# Patient Record
Sex: Female | Born: 1995 | Race: Black or African American | Hispanic: No | Smoking: Current every day smoker
Health system: Southern US, Community
[De-identification: ages and names within clinical notes are randomized; demographics above are authoritative.]

## PROBLEM LIST (undated history)

## (undated) DIAGNOSIS — I10 Essential (primary) hypertension: Secondary | ICD-10-CM

## (undated) DIAGNOSIS — N390 Urinary tract infection, site not specified: Secondary | ICD-10-CM

## (undated) HISTORY — PX: BLADDER NECK RECONSTRUCTION: SHX1239

---
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
## 2017-07-01 ENCOUNTER — Other Ambulatory Visit: Payer: Self-pay

## 2017-07-01 ENCOUNTER — Emergency Department (HOSPITAL_COMMUNITY): Payer: Self-pay

## 2017-07-01 ENCOUNTER — Emergency Department (HOSPITAL_COMMUNITY)
Admission: EM | Admit: 2017-07-01 | Discharge: 2017-07-02 | Disposition: A | Discharge status: Home / Self Care | Payer: Self-pay | Attending: Emergency Medicine | Admitting: Emergency Medicine

## 2017-07-01 ENCOUNTER — Encounter (HOSPITAL_COMMUNITY): Payer: Self-pay | Admitting: Emergency Medicine

## 2017-07-01 DIAGNOSIS — R002 Palpitations: Secondary | ICD-10-CM | POA: Insufficient documentation

## 2017-07-01 DIAGNOSIS — R110 Nausea: Secondary | ICD-10-CM | POA: Insufficient documentation

## 2017-07-01 DIAGNOSIS — R102 Pelvic and perineal pain: Secondary | ICD-10-CM | POA: Insufficient documentation

## 2017-07-01 DIAGNOSIS — R000 Tachycardia, unspecified: Secondary | ICD-10-CM | POA: Insufficient documentation

## 2017-07-01 DIAGNOSIS — R300 Dysuria: Secondary | ICD-10-CM | POA: Insufficient documentation

## 2017-07-01 DIAGNOSIS — R1030 Lower abdominal pain, unspecified: Secondary | ICD-10-CM | POA: Insufficient documentation

## 2017-07-01 DIAGNOSIS — R011 Cardiac murmur, unspecified: Secondary | ICD-10-CM | POA: Insufficient documentation

## 2017-07-01 DIAGNOSIS — I10 Essential (primary) hypertension: Secondary | ICD-10-CM | POA: Insufficient documentation

## 2017-07-01 DIAGNOSIS — N390 Urinary tract infection, site not specified: Secondary | ICD-10-CM | POA: Insufficient documentation

## 2017-07-01 DIAGNOSIS — R072 Precordial pain: Secondary | ICD-10-CM | POA: Insufficient documentation

## 2017-07-01 DIAGNOSIS — R42 Dizziness and giddiness: Secondary | ICD-10-CM | POA: Insufficient documentation

## 2017-07-01 DIAGNOSIS — R5383 Other fatigue: Secondary | ICD-10-CM | POA: Insufficient documentation

## 2017-07-01 DIAGNOSIS — R0602 Shortness of breath: Secondary | ICD-10-CM | POA: Insufficient documentation

## 2017-07-01 LAB — URINALYSIS, ROUTINE W REFLEX MICROSCOPIC
Bilirubin Urine: NEGATIVE
Glucose, UA: NEGATIVE mg/dL
Ketones, ur: NEGATIVE mg/dL
Nitrite: NEGATIVE
Protein, ur: 30 mg/dL — AB
Specific Gravity, Urine: 1.005 (ref 1.005–1.030)
pH: 6 (ref 5.0–8.0)

## 2017-07-01 LAB — BASIC METABOLIC PANEL
Anion gap: 10 (ref 5–15)
BUN: 11 mg/dL (ref 6–20)
CO2: 21 mmol/L — ABNORMAL LOW (ref 22–32)
Calcium: 8.9 mg/dL (ref 8.9–10.3)
Chloride: 105 mmol/L (ref 101–111)
Creatinine, Ser: 1.06 mg/dL — ABNORMAL HIGH (ref 0.44–1.00)
GFR calc Af Amer: >60 mL/min (ref >60)
GFR calc non Af Amer: >60 mL/min (ref >60)
Glucose, Bld: 81 mg/dL (ref 65–99)
Potassium: 4.4 mmol/L (ref 3.5–5.1)
Sodium: 136 mmol/L (ref 135–145)

## 2017-07-01 LAB — CBC
HCT: 33.4 % — ABNORMAL LOW (ref 36.0–46.0)
Hemoglobin: 10.8 g/dL — ABNORMAL LOW (ref 12.0–15.0)
MCH: 24.3 pg — ABNORMAL LOW (ref 26.0–34.0)
MCHC: 32.3 g/dL (ref 30.0–36.0)
MCV: 75.1 fL — ABNORMAL LOW (ref 78.0–100.0)
Platelets: 310 10*3/uL (ref 150–400)
RBC: 4.45 MIL/uL (ref 3.87–5.11)
RDW: 15.8 % — ABNORMAL HIGH (ref 11.5–15.5)
WBC: 8.9 10*3/uL (ref 4.0–10.5)

## 2017-07-01 LAB — I-STAT TROPONIN, ED: TROPONIN I, POC: 0 ng/mL (ref 0.00–0.08)

## 2017-07-01 LAB — I-STAT BETA HCG BLOOD, ED (MC, WL, AP ONLY)

## 2017-07-01 LAB — D-DIMER, QUANTITATIVE: D-Dimer, Quant: 0.45 ug/mL-FEU (ref 0.00–0.50)

## 2017-07-01 LAB — SEDIMENTATION RATE: Sed Rate: 65 mm/hr — ABNORMAL HIGH (ref 0–22)

## 2017-07-01 MED ORDER — MORPHINE SULFATE (PF) 4 MG/ML IV SOLN
4.0000 mg | Freq: Once | INTRAVENOUS | Status: AC
  Administered 2017-07-01: 4 mg via INTRAVENOUS
  Filled 2017-07-01: qty 1

## 2017-07-01 MED ORDER — DEXTROSE 5 % IV SOLN
1.0000 g | Freq: Once | INTRAVENOUS | Status: AC
  Administered 2017-07-01: 1 g via INTRAVENOUS
  Filled 2017-07-01: qty 10

## 2017-07-01 NOTE — ED Provider Notes (Signed)
[facility] DEPT Provider Note   CSN: [id number] Arrival date & time: 07/01/17  1943     History   Chief Complaint Chief Complaint  Patient presents with  . Chest Pain  . Vaginal Pain    HPI Isabella Pena is a 21 y.o. female.  The history is provided by the patient and medical records. No language interpreter was used.  Chest Pain   This is a new problem. The current episode started 3 to 5 hours ago. The problem occurs constantly. The problem has not changed since onset.The pain is present in the substernal region. The pain is at a severity of 8/10. The pain is severe. The quality of the pain is described as sharp and pressure-like (pinching pain). The pain does not radiate. Duration of episode(s) is 4 hours. Associated symptoms include abdominal pain, malaise/fatigue, nausea, palpitations and shortness of breath. Pertinent negatives include no back pain, no cough, no diaphoresis, no dizziness (lightheaded present), no exertional chest pressure, no fever, no headaches, no hemoptysis, no lower extremity edema, no near-syncope, no numbness, no sputum production, no vomiting and no weakness. She has tried nothing for the symptoms. The treatment provided no relief.  Pertinent negatives for past medical history include no seizures. Past medical history comments: prior myopericarditis    Past Medical History:  Diagnosis Date  . Hypertension   . Urinary tract infection     Patient Active Problem List   Diagnosis Date Noted  . Acute myopericarditis 05/05/2016  . Acute UTI   . Chest pain   . UTI (urinary tract infection) 05/03/2016  . Hypokalemia 05/03/2016  . Normocytic anemia 05/03/2016    Past Surgical History:  Procedure Laterality Date  . BLADDER NECK RECONSTRUCTION      OB History    No data available       Home Medications    Prior to Admission medications   Medication Sig Start Date End Date Taking? Authorizing Provider    colchicine 0.6 MG tablet Take 1 tablet (0.6 mg total) by mouth 2 (two) times daily. 05/05/16   Hongalgi, Lenis Dickinson, MD  [employer] VITAMIN D3 1000 units capsule Use as directed 1 tablet in the mouth or throat daily. 03/20/16   [provider]  ibuprofen (ADVIL,MOTRIN) 400 MG tablet Take 1 tablet (400 mg total) by mouth 2 (two) times daily. 05/21/16   Bhagat, Crista Luria, PA  metoprolol tartrate (LOPRESSOR) 25 MG tablet Take 1 tablet (25 mg total) by mouth 2 (two) times daily. 05/05/16   Hongalgi, Lenis Dickinson, MD  pantoprazole (PROTONIX) 20 MG tablet Take 1 tablet (20 mg total) by mouth daily. 05/05/16   Hongalgi, Lenis Dickinson, MD  traZODone (DESYREL) 50 MG tablet Take 1 tablet by mouth at bedtime. 04/16/16   [provider]    Family History Family History  Problem Relation Age of Onset  . Hyperlipidemia Father   . Pancreatic cancer Paternal Grandmother     Social History Social History   Tobacco Use  . Smoking status: Never Smoker  . Smokeless tobacco: Never Used  Substance Use Topics  . Alcohol use: No  . Drug use: No     Allergies   Ciprofloxacin   Review of Systems Review of Systems  Constitutional: Positive for malaise/fatigue. Negative for chills, diaphoresis, fatigue and fever.  HENT: Negative for congestion.   Eyes: Negative for visual disturbance.  Respiratory: Positive for chest tightness and shortness of breath. Negative for cough, hemoptysis, sputum production, wheezing and stridor.  Cardiovascular: Positive for chest pain and palpitations. Negative for leg swelling and near-syncope.  Gastrointestinal: Positive for abdominal pain and nausea. Negative for constipation, diarrhea and vomiting.  Genitourinary: Positive for dysuria. Negative for flank pain and frequency.  Musculoskeletal: Negative for back pain, neck pain and neck stiffness.  Skin: Negative for rash and wound.  Neurological: Positive for light-headedness. Negative for dizziness (lightheaded present),  seizures, facial asymmetry, weakness, numbness and headaches.  Psychiatric/Behavioral: Negative for agitation and confusion.  All other systems reviewed and are negative.    Physical Exam Updated Vital Signs BP 125/76 (BP Location: Left Arm)   Pulse (!) 108   Temp 98.5 F (36.9 C) (Oral)   Resp 20   Ht 5' (1.524 m)   Wt 117.8 kg (259 lb 9.6 oz)   LMP 06/29/2017   SpO2 100%   BMI 50.70 kg/m   Physical Exam  Constitutional: She is oriented to person, place, and time. She appears well-developed and well-nourished. No distress.  HENT:  Head: Normocephalic.  Mouth/Throat: Oropharynx is clear and moist. No oropharyngeal exudate.  Eyes: Conjunctivae and EOM are normal. Pupils are equal, round, and reactive to light.  Neck: Normal range of motion.  Cardiovascular: Regular rhythm and intact distal pulses. Tachycardia present.  Murmur heard.  Diastolic murmur is present. Pulmonary/Chest: Effort normal. No stridor. No tachypnea. No respiratory distress. She has no wheezes. She has no rhonchi. She has no rales. She exhibits no tenderness.  Abdominal: Soft. Normal appearance and bowel sounds are normal. She exhibits no distension. There is tenderness in the suprapubic area. There is no rigidity, no rebound and no CVA tenderness.    Musculoskeletal: She exhibits no edema or tenderness.  Neurological: She is alert and oriented to person, place, and time. No cranial nerve deficit or sensory deficit. She exhibits normal muscle tone.  Skin: Capillary refill takes less than 2 seconds. No rash noted. She is not diaphoretic. No erythema.  Psychiatric: She has a normal mood and affect.  Nursing note and vitals reviewed.    ED Treatments / Results  Labs (all labs ordered are listed, but only abnormal results are displayed) Labs Reviewed  BASIC METABOLIC PANEL - Abnormal; Notable for the following components:      Result Value   CO2 21 (*)    Creatinine, Ser 1.06 (*)    All other components  within normal limits  CBC - Abnormal; Notable for the following components:   Hemoglobin 10.8 (*)    HCT 33.4 (*)    MCV 75.1 (*)    MCH 24.3 (*)    RDW 15.8 (*)    All other components within normal limits  URINALYSIS, ROUTINE W REFLEX MICROSCOPIC - Abnormal; Notable for the following components:   APPearance TURBID (*)    Hgb urine dipstick LARGE (*)    Protein, ur 30 (*)    Leukocytes, UA LARGE (*)    Bacteria, UA MANY (*)    Squamous Epithelial / LPF 0-5 (*)    Non Squamous Epithelial 0-5 (*)    All other components within normal limits  SEDIMENTATION RATE - Abnormal; Notable for the following components:   Sed Rate 65 (*)    All other components within normal limits  URINE CULTURE  D-DIMER, QUANTITATIVE (NOT AT [facility])  I-STAT TROPONIN, ED  I-STAT BETA HCG BLOOD, ED ([facility], [facility], [facility] ONLY)  I-STAT TROPONIN, ED  I-STAT TROPONIN, ED    EKG  EKG Interpretation  Date/Time:  Monday July 01 2017 19:51:01  EST Ventricular Rate:  106 PR Interval:    QRS Duration: 83 QT Interval:  303 QTC Calculation: 403 R Axis:   65 Text Interpretation:  Sinus tachycardia Borderline Q waves in inferior leads When compared to prior, t waves improved in lead 3.  No STEMI Confirmed by Antony Blackbird [phone]) on 07/01/2017 10:07:06 PM       Radiology Dg Chest 2 View  Result Date: 07/01/2017 CLINICAL DATA:  Chest pain EXAM: CHEST  2 VIEW COMPARISON:  05/03/2017 FINDINGS: The heart size and mediastinal contours are within normal limits. Both lungs are clear. The visualized skeletal structures are unremarkable. IMPRESSION: No active cardiopulmonary disease. Electronically Signed   By: Donavan Foil M.D.   On: 07/01/2017 21:30    Procedures Procedures (including critical care time)  Medications Ordered in ED Medications  cefTRIAXone (ROCEPHIN) 1 g in dextrose 5 % 50 mL IVPB (0 g Intravenous Stopped 07/01/17 2358)  morphine 4 MG/ML injection 4 mg (4 mg Intravenous Given 07/01/17 2241)      Initial Impression / Assessment and Plan / ED Course  I have reviewed the triage vital signs and the nursing notes.  Pertinent labs & imaging results that were available during my care of the patient were reviewed by me and considered in my medical decision making (see chart for details).     Isabella Pena is a 21 y.o. 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  Patient reports that for the last 2 days she has had some pain with urination and and cloudy urine.  Patient feels that she may have a UTI.  Patient had some tenderness in her lower abdomen but no pain in the flanks or back.  Patient says that today while at work at approximately 5:30 PM, she began other symptoms including severe chest pain.  She described the pain as a pressure and pinching sensation.  She says this feels "the exact same as when I had my heart infection".  She reports that the pain did not radiate but she had associated nausea, lightheadedness, palpitations, shortness of breath, and fatigue.  She denies vomiting or diaphoresis.  She says it is not exertional and not pleuritic.  She denies any bowel symptoms or other complaints.  On arrival, patient was tachycardic.  Patient's EKG showed no evidence of ST elevation MI.     On exam, chest is nontender.  Lungs are clear.  Patient does have a diastolic murmur.  Abdomen is tender in the lower abdomen but no upper abdominal pain or flank pain or CVA tenderness.  Legs were not significantly edematous but they were large due to obesity.  Bedside ultrasound was attempted however due to patient's body habitus it was difficult to assess for pericardial effusion.  Formal ultrasound be ordered given the history of pericarditis,  myocarditis, and pericardial effusion.  Laboratory testing will be ordered to look for abnormalities.  Urinalysis showed leukocytes and bacteria.  In the setting of discomfort and her urinary symptoms she will be treated for UTI.  Rocephin was ordered.  Given the tachycardia, d-dimer was ordered.  It was negative.  Initial troponin was negative patient will likely need delta troponin as she is only had symptoms for several hours.  CBC shows no leukocytosis.  BMP overall reassuring.  Patient is awaiting ESR as she had  this evaluated and was elevated with her last pericarditis.  Patient is awaiting formal cardiac ultrasound to look for abnormalities.  Patient was given pain medicine to help with her symptoms.  Anticipate reassessment after diagnostic testing.  The patient has improvement in symptoms and has no evidence of recurrent pericarditis, pericardial effusion, or further chest pain, patient may be stable for discharge home.  Patient is safe for discharge, patient will need antibiotics for urinary tract infection and PCP follow-up.  Care transferred to Dr. Roxanne Mins while awaiting reassessment and imaging results.   Final Clinical Impressions(s) / ED Diagnoses   Final diagnoses:  Precordial pain  Shortness of breath  Lower abdominal pain  Lower urinary tract infectious disease     Clinical Impression: 1. Precordial pain   2. Shortness of breath   3. Lower abdominal pain   4. Lower urinary tract infectious disease     Disposition: Awaiting [place] results and reassessment.    Tegeler, Gwenyth Allegra, MD 07/02/17 [phone]

## 2017-07-01 NOTE — ED Triage Notes (Signed)
Patient is complaining of left chest pain. Patient states it started 30 minutes ago. Patient states she is having vaginal pain also. Patient has a hx of fluid and infection around her heart per patient.

## 2017-07-02 ENCOUNTER — Emergency Department (HOSPITAL_BASED_OUTPATIENT_CLINIC_OR_DEPARTMENT_OTHER): Payer: Self-pay

## 2017-07-02 DIAGNOSIS — I361 Nonrheumatic tricuspid (valve) insufficiency: Secondary | ICD-10-CM

## 2017-07-02 LAB — ECHOCARDIOGRAM LIMITED
Height: 60 in
Weight: 4153.6 oz

## 2017-07-02 LAB — I-STAT TROPONIN, ED: Troponin i, poc: 0 ng/mL (ref 0.00–0.08)

## 2017-07-02 MED ORDER — NITROFURANTOIN MONOHYD MACRO 100 MG PO CAPS: 100.0000 mg | ORAL_CAPSULE | Freq: Two times a day (BID) | ORAL | 0 refills | Status: DC

## 2017-07-02 NOTE — ED Notes (Signed)
[facility]. DR. Julieanne MansonEGLER MADE AWARE.

## 2017-07-02 NOTE — Progress Notes (Signed)
  Echocardiogram 2D Echocardiogram has been performed.  Isabella SkeenVijay  Isabella Pena 07/02/2017, 1:44 AM

## 2017-07-02 NOTE — ED Provider Notes (Signed)
Patient with chest discomfort and history of myocarditis signed out to me to evaluate echocardiogram.  She also was noted to have a urinary tract infection.  Echocardiogram has come back essentially normal-no wall motion abnormalities and normal ejection fraction.  Troponin is normal x2.  With her previous episode of myocarditis, she had an elevated troponin.  She states she is now feeling back to normal.  I do not feel that this is recurrent myocarditis given lack of elevated troponin and normal echocardiogram.  She is discharged with prescription for nitrofurantoin for her urinary tract infection is to follow-up with her PCP.   Dione BoozeGlick, Letrell Attwood, MD 07/02/17 [phone]

## 2017-07-02 NOTE — ED Notes (Signed)
Bed: ZO10WA16 Expected date:  Expected time:  Means of arrival:  Comments: Rm 6

## 2017-07-02 NOTE — Discharge Instructions (Signed)
Your evaluation did not show any evidence of any inflammation in the heart.  Take acetaminophen and/or ibuprofen as needed for pain.  If there is anything of concern, feel free to return for further evaluation.

## 2017-07-05 LAB — URINE CULTURE

## 2017-07-06 ENCOUNTER — Telehealth: Payer: Self-pay

## 2017-07-06 NOTE — Telephone Encounter (Signed)
Post ED Visit - Positive Culture Follow-up  Culture report reviewed by antimicrobial stewardship pharmacist:  []  Enzo BiNathan Batchelder, Pharm.D. []  Celedonio MiyamotoJeremy Frens, Pharm.D., BCPS AQ-ID []  Garvin FilaMike Maccia, Pharm.D., BCPS []  Georgina PillionElizabeth Martin, [address]Ph[address] BCPS []  Claire CityMinh Pham, [address]Ph[address]D., BCPS, AAHIVP []  Estella HuskMichelle Turner, Pharm.D., BCPS, AAHIVP []  Lysle Pearlachel Rumbarger, PharmD, BCPS []  Casilda Carlsaylor Stone, PharmD, BCPS []  Pollyann SamplesAndy Johnston, PharmD, BCPS Sharin MonsEmily Sinclair Pharm D Positive urine culture Treated with Macrobid, organism sensitive to the same and no further patient follow-up is required at this time.  Jerry CarasCullom, Lucrezia Dehne Burnett 07/06/2017, 10:28 AM

## 2017-08-02 ENCOUNTER — Emergency Department (HOSPITAL_COMMUNITY)
Admission: EM | Admit: 2017-08-02 | Discharge: 2017-08-03 | Disposition: A | Discharge status: Home / Self Care | Payer: Medicaid Other | Attending: Emergency Medicine | Admitting: Emergency Medicine

## 2017-08-02 ENCOUNTER — Encounter (HOSPITAL_COMMUNITY): Payer: Self-pay | Admitting: Emergency Medicine

## 2017-08-02 DIAGNOSIS — R319 Hematuria, unspecified: Secondary | ICD-10-CM | POA: Insufficient documentation

## 2017-08-02 DIAGNOSIS — R350 Frequency of micturition: Secondary | ICD-10-CM | POA: Insufficient documentation

## 2017-08-02 DIAGNOSIS — R300 Dysuria: Secondary | ICD-10-CM | POA: Insufficient documentation

## 2017-08-02 DIAGNOSIS — I10 Essential (primary) hypertension: Secondary | ICD-10-CM | POA: Insufficient documentation

## 2017-08-02 DIAGNOSIS — N3091 Cystitis, unspecified with hematuria: Secondary | ICD-10-CM | POA: Insufficient documentation

## 2017-08-02 LAB — URINALYSIS, ROUTINE W REFLEX MICROSCOPIC: SQUAMOUS EPITHELIAL / LPF: NONE SEEN

## 2017-08-02 LAB — POC URINE PREG, ED: PREG TEST UR: NEGATIVE

## 2017-08-02 LAB — WET PREP, GENITAL
CLUE CELLS WET PREP: NONE SEEN
Sperm: NONE SEEN
TRICH WET PREP: NONE SEEN
WBC, Wet Prep HPF POC: NONE SEEN

## 2017-08-02 MED ORDER — ACETAMINOPHEN 500 MG PO TABS
1000.0000 mg | ORAL_TABLET | Freq: Once | ORAL | Status: AC
  Administered 2017-08-03: 1000 mg via ORAL
  Filled 2017-08-02: qty 2

## 2017-08-02 MED ORDER — CEFTRIAXONE SODIUM 1 G IJ SOLR
1.0000 g | Freq: Once | INTRAMUSCULAR | Status: AC
Start: 2017-08-03 — End: 2017-08-03
  Administered 2017-08-03: 1 g via INTRAMUSCULAR
  Filled 2017-08-02: qty 10

## 2017-08-02 MED ORDER — PHENAZOPYRIDINE HCL 200 MG PO TABS
200.0000 mg | ORAL_TABLET | Freq: Three times a day (TID) | ORAL | Status: DC
  Administered 2017-08-03: 200 mg via ORAL
  Filled 2017-08-02: qty 1

## 2017-08-02 NOTE — ED Provider Notes (Signed)
[facility]-EMERGENCY DEPT Provider Note   CSN: [id number] Arrival date & time: 08/02/17  1635     History   Chief Complaint Chief Complaint  Patient presents with  . Pelvic Pain  . blood clots    HPI Isabella Pena is a 22 y.o. female.  The history is provided by the patient.  Pelvic Pain  This is a recurrent problem. The current episode started yesterday. The problem occurs constantly. The problem has not changed since onset.Pertinent negatives include no chest pain, no abdominal pain, no headaches and no shortness of breath. Nothing aggravates the symptoms. Nothing relieves the symptoms. She has tried nothing for the symptoms. The treatment provided no relief.  Dysuria   This is a recurrent problem. The current episode started more than 2 days ago. The problem occurs every urination. The problem has not changed since onset.The quality of the pain is described as burning. The pain is severe. There has been no fever. Associated symptoms include frequency and hematuria. Associated symptoms comments: Passes blood with her UTIs . She has tried nothing for the symptoms. Her past medical history is significant for recurrent UTIs. Her past medical history does not include kidney stones.  Had an Ecoli UTI that was pan sensitive a month ago.  Had gross blood with that episode as well.  No f/c/r.  Nop flank pain.    Past Medical History:  Diagnosis Date  . Hypertension   . Urinary tract infection     Patient Active Problem List   Diagnosis Date Noted  . Acute myopericarditis 05/05/2016  . Acute UTI   . Chest pain   . UTI (urinary tract infection) 05/03/2016  . Hypokalemia 05/03/2016  . Normocytic anemia 05/03/2016    Past Surgical History:  Procedure Laterality Date  . BLADDER NECK RECONSTRUCTION      OB History    No data available       Home Medications    Prior to Admission medications   Medication Sig Start Date End Date Taking?  Authorizing Provider  nitrofurantoin, macrocrystal-monohydrate, (MACROBID) 100 MG capsule Take 1 capsule (100 mg total) by mouth 2 (two) times daily. 07/02/17   Dione Booze, MD    Family History Family History  Problem Relation Age of Onset  . Hyperlipidemia Father   . Pancreatic cancer Paternal Grandmother     Social History Social History   Tobacco Use  . Smoking status: Never Smoker  . Smokeless tobacco: Never Used  Substance Use Topics  . Alcohol use: No  . Drug use: No     Allergies   Ciprofloxacin   Review of Systems Review of Systems  Constitutional: Negative for fever.  Respiratory: Negative for shortness of breath.   Cardiovascular: Negative for chest pain.  Gastrointestinal: Negative for abdominal pain.  Genitourinary: Positive for dysuria, frequency, hematuria and pelvic pain.  Neurological: Negative for headaches.  All other systems reviewed and are negative.    Physical Exam Updated Vital Signs BP 131/78 (BP Location: Right Arm)   Pulse 95   Temp 98.6 F (37 C) (Oral)   Resp 18   LMP 07/26/2017   SpO2 100%   Physical Exam  Constitutional: She is oriented to person, place, and time. She appears well-developed and well-nourished. No distress.  HENT:  Head: Normocephalic and atraumatic.  Mouth/Throat: No oropharyngeal exudate.  Eyes: Conjunctivae are normal. Pupils are equal, round, and reactive to light.  Neck: Normal range of motion.  Cardiovascular: Normal rate, regular rhythm,  normal heart sounds and intact distal pulses.  Pulmonary/Chest: Effort normal and breath sounds normal. No stridor.  Abdominal: Soft. Bowel sounds are normal. She exhibits no mass. There is no tenderness. There is no rebound and no guarding.  Genitourinary: Vaginal discharge found.  Genitourinary Comments: Chaperone present.  No CMT   Musculoskeletal: Normal range of motion.  Neurological: She is alert and oriented to person, place, and time. She displays normal  reflexes.  Skin: Skin is warm and dry. Capillary refill takes less than 2 seconds.  Psychiatric: She has a normal mood and affect.  Nursing note and vitals reviewed.    ED Treatments / Results  Labs (all labs ordered are listed, but only abnormal results are displayed)  Results for orders placed or performed during the hospital encounter of 08/02/17  Urinalysis, Routine w reflex microscopic  Result Value Ref Range   Color, Urine RED (A) YELLOW   APPearance CLOUDY (A) CLEAR   Specific Gravity, Urine  1.005 - 1.030    TEST NOT REPORTED DUE TO COLOR INTERFERENCE OF URINE PIGMENT   pH  5.0 - 8.0    TEST NOT REPORTED DUE TO COLOR INTERFERENCE OF URINE PIGMENT   Glucose, UA (A) NEGATIVE mg/dL    TEST NOT REPORTED DUE TO COLOR INTERFERENCE OF URINE PIGMENT   Hgb urine dipstick (A) NEGATIVE    TEST NOT REPORTED DUE TO COLOR INTERFERENCE OF URINE PIGMENT   Bilirubin Urine (A) NEGATIVE    TEST NOT REPORTED DUE TO COLOR INTERFERENCE OF URINE PIGMENT   Ketones, ur (A) NEGATIVE mg/dL    TEST NOT REPORTED DUE TO COLOR INTERFERENCE OF URINE PIGMENT   Protein, ur (A) NEGATIVE mg/dL    TEST NOT REPORTED DUE TO COLOR INTERFERENCE OF URINE PIGMENT   Nitrite (A) NEGATIVE    TEST NOT REPORTED DUE TO COLOR INTERFERENCE OF URINE PIGMENT   Leukocytes, UA (A) NEGATIVE    TEST NOT REPORTED DUE TO COLOR INTERFERENCE OF URINE PIGMENT   RBC / HPF TOO NUMEROUS TO COUNT 0 - 5 RBC/hpf   WBC, UA TOO NUMEROUS TO COUNT 0 - 5 WBC/hpf   Bacteria, UA MANY (A) NONE SEEN   Squamous Epithelial / LPF NONE SEEN NONE SEEN   WBC Clumps PRESENT   POC Urine Pregnancy, ED ([facility])  Result Value Ref Range   Preg Test, Ur NEGATIVE NEGATIVE   No results found.  Radiology No results found.  Procedures Procedures (including critical care time)  Medications Ordered in ED Medications - No data to display     Final Clinical Impressions(s) / ED Diagnoses  Hemorrhagic cystitis.  No flank pain to  indicate stones.  Exam and history are consistent with UTI.  Recommend follow up with PMD and urology secondary to repeat infections.   Return for weakness, numbness, changes in vision or speech,  fevers > 100.4 unrelieved by medication, shortness of breath, intractable vomiting, or diarrhea, abdominal pain, Inability to tolerate liquids or food, cough, altered mental status or any concerns. No signs of systemic illness or infection. The patient is nontoxic-appearing on exam and vital signs are within normal limits.    I have reviewed the triage vital signs and the nursing notes. Pertinent labs &imaging results that were available during my care of the patient were reviewed by me and considered in my medical decision making (see chart for details).  After history, exam, and medical workup I feel the patient has been appropriately medically screened and is  safe for discharge home. Pertinent diagnoses were discussed with the patient. Patient was given return precautions.      Renata Gambino, MD 08/03/17 [phone]

## 2017-08-02 NOTE — ED Triage Notes (Signed)
patient reports having vaginal pain and blood clots every time uses restroom. Denies any bleeding in between urinating.

## 2017-08-03 ENCOUNTER — Encounter (HOSPITAL_COMMUNITY): Payer: Self-pay | Admitting: Emergency Medicine

## 2017-08-03 MED ORDER — CEPHALEXIN 500 MG PO CAPS: 500.0000 mg | ORAL_CAPSULE | Freq: Four times a day (QID) | ORAL | 0 refills | Status: DC

## 2017-08-03 MED ORDER — STERILE WATER FOR INJECTION IJ SOLN
INTRAMUSCULAR | Status: AC
  Administered 2017-08-03: 10 mL
  Filled 2017-08-03: qty 10

## 2017-08-05 LAB — GC/CHLAMYDIA PROBE AMP (~~LOC~~) NOT AT ARMC
CHLAMYDIA, DNA PROBE: NEGATIVE
NEISSERIA GONORRHEA: NEGATIVE

## 2018-09-23 ENCOUNTER — Other Ambulatory Visit: Payer: Self-pay

## 2018-09-23 ENCOUNTER — Emergency Department (HOSPITAL_COMMUNITY)
Admission: EM | Admit: 2018-09-23 | Discharge: 2018-09-23 | Disposition: A | Discharge status: Home / Self Care | Payer: Medicaid Other | Attending: Emergency Medicine | Admitting: Emergency Medicine

## 2018-09-23 ENCOUNTER — Encounter (HOSPITAL_COMMUNITY): Payer: Self-pay | Admitting: Emergency Medicine

## 2018-09-23 DIAGNOSIS — I10 Essential (primary) hypertension: Secondary | ICD-10-CM | POA: Insufficient documentation

## 2018-09-23 DIAGNOSIS — J069 Acute upper respiratory infection, unspecified: Secondary | ICD-10-CM

## 2018-09-23 DIAGNOSIS — R1110 Vomiting, unspecified: Secondary | ICD-10-CM

## 2018-09-23 DIAGNOSIS — B9789 Other viral agents as the cause of diseases classified elsewhere: Secondary | ICD-10-CM | POA: Insufficient documentation

## 2018-09-23 LAB — INFLUENZA PANEL BY PCR (TYPE A & B)
INFLBPCR: NEGATIVE
Influenza A By PCR: NEGATIVE

## 2018-09-23 LAB — GROUP A STREP BY PCR: GROUP A STREP BY PCR: NOT DETECTED

## 2018-09-23 NOTE — Discharge Instructions (Signed)
Please take cough/cold medicine as needed Return if worsening

## 2018-09-23 NOTE — ED Triage Notes (Signed)
Pt c/o cough, fever 100 vomiting since yesterday morning. Reports fever came down after taking medications for it.  Reports works at a [employer].

## 2018-09-23 NOTE — ED Provider Notes (Signed)
[facility]-EMERGENCY DEPT Provider Note   CSN: [id number] Arrival date & time: 09/23/18  0347    History   Chief Complaint Chief Complaint  Patient presents with  . Emesis  . Cough  . Sore Throat    HPI Isabella Pena is a 23 y.o. female who presents with fever and cough.  No significant past medical history.  The patient states that yesterday she started to feel bad.  She reports a fever of 100 degrees and cough.  Cough has been nonproductive.  She took Tylenol for the fever and this resolved.  She reports associated sore throat, headache, runny nose, body aches, shortness of breath.  She also has had some posttussive vomiting.  She denies severe abdominal pain, nausea, diarrhea, urinary symptoms.  She has possible sick contacts at work.  She works at a [employer].  No recent travel.     HPI  Past Medical History:  Diagnosis Date  . Hypertension   . Urinary tract infection     Patient Active Problem List   Diagnosis Date Noted  . Acute myopericarditis 05/05/2016  . Acute UTI   . Chest pain   . UTI (urinary tract infection) 05/03/2016  . Hypokalemia 05/03/2016  . Normocytic anemia 05/03/2016    Past Surgical History:  Procedure Laterality Date  . BLADDER NECK RECONSTRUCTION       OB History   No obstetric history on file.      Home Medications    Prior to Admission medications   Medication Sig Start Date End Date Taking? Authorizing Provider  cephALEXin (KEFLEX) 500 MG capsule Take 1 capsule (500 mg total) by mouth 4 (four) times daily. Patient not taking: Reported on 09/23/2018 08/03/17   Palumbo, April, MD  nitrofurantoin, macrocrystal-monohydrate, (MACROBID) 100 MG capsule Take 1 capsule (100 mg total) by mouth 2 (two) times daily. Patient not taking: Reported on 08/02/2017 07/02/17   Dione Booze, MD    Family History Family History  Problem Relation Age of Onset  . Hyperlipidemia Father   . Pancreatic cancer Paternal  Grandmother     Social History Social History   Tobacco Use  . Smoking status: Never Smoker  . Smokeless tobacco: Never Used  Substance Use Topics  . Alcohol use: No  . Drug use: No     Allergies   Ciprofloxacin   Review of Systems Review of Systems  Constitutional: Positive for fever.  HENT: Positive for rhinorrhea and sore throat.   Respiratory: Positive for cough and shortness of breath.   Cardiovascular: Negative for chest pain.  Gastrointestinal: Positive for vomiting. Negative for abdominal pain.  All other systems reviewed and are negative.    Physical Exam Updated Vital Signs BP 133/76 (BP Location: Left Arm)   Pulse 89   Temp 98.4 F (36.9 C) (Oral)   Resp 19   LMP 09/02/2018   SpO2 99%   Physical Exam Vitals signs and nursing note reviewed.  Constitutional:      General: She is not in acute distress.    Appearance: She is well-developed. She is obese. She is not ill-appearing.     Comments: Well appearing. Texting on phone  HENT:     Head: Normocephalic and atraumatic.     Right Ear: Tympanic membrane normal.     Left Ear: Tympanic membrane normal.     Nose:     Right Turbinates: Enlarged.     Left Turbinates: Enlarged.     Mouth/Throat:  Lips: Pink.     Mouth: Mucous membranes are moist.     Pharynx: Oropharynx is clear.     Tonsils: Swelling: 1+ on the right. 1+ on the left.  Eyes:     General: No scleral icterus.       Right eye: No discharge.        Left eye: No discharge.     Conjunctiva/sclera: Conjunctivae normal.     Pupils: Pupils are equal, round, and reactive to light.  Neck:     Musculoskeletal: Normal range of motion.  Cardiovascular:     Rate and Rhythm: Normal rate and regular rhythm.  Pulmonary:     Effort: Pulmonary effort is normal. No respiratory distress.     Breath sounds: Normal breath sounds.  Abdominal:     General: There is no distension.  Skin:    General: Skin is warm and dry.  Neurological:     Mental  Status: She is alert and oriented to person, place, and time.  Psychiatric:        Behavior: Behavior normal.      ED Treatments / Results  Labs (all labs ordered are listed, but only abnormal results are displayed) Labs Reviewed  GROUP A STREP BY PCR  INFLUENZA PANEL BY PCR (TYPE A & B)    EKG None  Radiology No results found.  Procedures Procedures (including critical care time)  Medications Ordered in ED Medications - No data to display   Initial Impression / Assessment and Plan / ED Course  I have reviewed the triage vital signs and the nursing notes.  Pertinent labs & imaging results that were available during my care of the patient were reviewed by me and considered in my medical decision making (see chart for details).  23 year old female with fever, URI symptoms and posttussive vomiting since yesterday.  Her vital signs are normal here.  Her exam is overall normal. Likely viral URI. She has not had any recent travel or contacts with any known COVID pt. No high fevers. She is well appearing. Flu and Strep are negative. Do not think she needs a CXR today. She was discharged and advised supportive care and given a work note.  Final Clinical Impressions(s) / ED Diagnoses   Final diagnoses:  Viral URI with cough  Post-tussive vomiting    ED Discharge Orders    None       Bethel Born, PA-C 09/23/18 1248    Gwyneth Sprout, MD 09/23/18 2046

## 2019-05-19 ENCOUNTER — Encounter (HOSPITAL_COMMUNITY): Payer: Self-pay | Admitting: Student

## 2019-05-19 ENCOUNTER — Inpatient Hospital Stay (HOSPITAL_COMMUNITY)
Admission: AD | Admit: 2019-05-19 | Discharge: 2019-05-19 | Disposition: A | Discharge status: Home / Self Care | Payer: Medicaid Other | Attending: Family Medicine | Admitting: Family Medicine

## 2019-05-19 ENCOUNTER — Other Ambulatory Visit: Payer: Self-pay

## 2019-05-19 ENCOUNTER — Ambulatory Visit (HOSPITAL_COMMUNITY)
Admission: EM | Admit: 2019-05-19 | Discharge: 2019-05-19 | Disposition: A | Discharge status: Home / Self Care | Payer: Medicaid Other | Attending: Family Medicine | Admitting: Family Medicine

## 2019-05-19 ENCOUNTER — Encounter (HOSPITAL_COMMUNITY): Payer: Self-pay

## 2019-05-19 ENCOUNTER — Inpatient Hospital Stay (HOSPITAL_COMMUNITY): Payer: Medicaid Other

## 2019-05-19 DIAGNOSIS — O161 Unspecified maternal hypertension, first trimester: Secondary | ICD-10-CM | POA: Diagnosis not present

## 2019-05-19 DIAGNOSIS — R1030 Lower abdominal pain, unspecified: Secondary | ICD-10-CM | POA: Insufficient documentation

## 2019-05-19 DIAGNOSIS — N390 Urinary tract infection, site not specified: Secondary | ICD-10-CM | POA: Diagnosis not present

## 2019-05-19 DIAGNOSIS — Z3A01 Less than 8 weeks gestation of pregnancy: Secondary | ICD-10-CM | POA: Insufficient documentation

## 2019-05-19 DIAGNOSIS — Z800 Family history of malignant neoplasm of digestive organs: Secondary | ICD-10-CM | POA: Insufficient documentation

## 2019-05-19 DIAGNOSIS — O2341 Unspecified infection of urinary tract in pregnancy, first trimester: Secondary | ICD-10-CM | POA: Diagnosis not present

## 2019-05-19 DIAGNOSIS — O99891 Other specified diseases and conditions complicating pregnancy: Secondary | ICD-10-CM | POA: Diagnosis present

## 2019-05-19 DIAGNOSIS — O26891 Other specified pregnancy related conditions, first trimester: Secondary | ICD-10-CM

## 2019-05-19 DIAGNOSIS — O3680X0 Pregnancy with inconclusive fetal viability, not applicable or unspecified: Secondary | ICD-10-CM | POA: Diagnosis not present

## 2019-05-19 DIAGNOSIS — Z881 Allergy status to other antibiotic agents status: Secondary | ICD-10-CM | POA: Insufficient documentation

## 2019-05-19 DIAGNOSIS — O26899 Other specified pregnancy related conditions, unspecified trimester: Secondary | ICD-10-CM

## 2019-05-19 DIAGNOSIS — Z3201 Encounter for pregnancy test, result positive: Secondary | ICD-10-CM | POA: Insufficient documentation

## 2019-05-19 DIAGNOSIS — Z79899 Other long term (current) drug therapy: Secondary | ICD-10-CM | POA: Diagnosis not present

## 2019-05-19 DIAGNOSIS — R109 Unspecified abdominal pain: Secondary | ICD-10-CM

## 2019-05-19 LAB — POCT URINALYSIS DIP (DEVICE)
Bilirubin Urine: NEGATIVE
Glucose, UA: NEGATIVE mg/dL
Ketones, ur: NEGATIVE mg/dL
Nitrite: POSITIVE — AB
Protein, ur: 30 mg/dL — AB
Specific Gravity, Urine: 1.02 (ref 1.005–1.030)
Urobilinogen, UA: 0.2 mg/dL (ref 0.0–1.0)
pH: 7 (ref 5.0–8.0)

## 2019-05-19 LAB — CBC
HCT: 36.3 % (ref 36.0–46.0)
Hemoglobin: 11.2 g/dL — ABNORMAL LOW (ref 12.0–15.0)
MCH: 25.5 pg — ABNORMAL LOW (ref 26.0–34.0)
MCHC: 30.9 g/dL (ref 30.0–36.0)
MCV: 82.5 fL (ref 80.0–100.0)
Platelets: 319 10*3/uL (ref 150–400)
RBC: 4.4 MIL/uL (ref 3.87–5.11)
RDW: 14.7 % (ref 11.5–15.5)
WBC: 11.5 10*3/uL — ABNORMAL HIGH (ref 4.0–10.5)
nRBC: 0 % (ref 0.0–0.2)

## 2019-05-19 LAB — POCT PREGNANCY, URINE: Preg Test, Ur: POSITIVE — AB

## 2019-05-19 LAB — WET PREP, GENITAL
Sperm: NONE SEEN
Trich, Wet Prep: NONE SEEN
Yeast Wet Prep HPF POC: NONE SEEN

## 2019-05-19 LAB — HCG, QUANTITATIVE, PREGNANCY: hCG, Beta Chain, Quant, S: 3275 m[IU]/mL — ABNORMAL HIGH (ref <5)

## 2019-05-19 LAB — ABO/RH
ABO/RH(D): A NEG
Antibody Screen: NEGATIVE

## 2019-05-19 MED ORDER — CEPHALEXIN 500 MG PO CAPS: 500.0000 mg | ORAL_CAPSULE | Freq: Two times a day (BID) | ORAL | 0 refills | Status: DC

## 2019-05-19 MED ORDER — DOXYLAMINE-PYRIDOXINE 10-10 MG PO TBEC: 2.0000 | DELAYED_RELEASE_TABLET | Freq: Every evening | ORAL | 0 refills | Status: DC | PRN

## 2019-05-19 NOTE — ED Triage Notes (Signed)
Patient presents to Urgent Care with complaints of lower abdominal pain since the past week, worse the last few days. Patient reports frequent urination.

## 2019-05-19 NOTE — MAU Note (Signed)
Pt presents to MAU c/o abdominal pain that is along her lower abdomen pt reports the pain is a 7/10 pt reports the pain feels like she is super bloated and has an occasional cramp. No vaginal bleeding. Pt reports having some nausea as well that has been ongoing x1week. Pts LMP was September 30th 2020. Pt was sent over from urgent care after testing positive on the UPT and being told she has a UTI. Pt states they instructed her to come get a [place].

## 2019-05-19 NOTE — MAU Provider Note (Signed)
History     CSN: [id number]  Arrival date and time: 05/19/19 1843   First Provider Initiated Contact with Patient 05/19/19 2047      Chief Complaint  Patient presents with  . Abdominal Pain  . Nausea  . Urinary Tract Infection   HPI Isabella Pena is a 23 y.o. G1P0 at [redacted]w[redacted]d based on certain LMP who presents to MAU with chief complaint of abdominal pain in early pregnancy. This is a new complaint, onset within the past week. Her pain is located in her lower abdomen, rated as 7/10 and does not radiate. She denies abdominal pain upon arrival to MAU.  Patient is s/p diagnosis of UTI at urgent care today. She denies dysuria, vaginal bleeding, abdominal tenderness, fever or recent illness.  OB History    Gravida  1   Para      Term      Preterm      AB      Living        SAB      TAB      Ectopic      Multiple      Live Births              Past Medical History:  Diagnosis Date  . Hypertension   . Urinary tract infection     Past Surgical History:  Procedure Laterality Date  . BLADDER NECK RECONSTRUCTION      Family History  Problem Relation Age of Onset  . Hyperlipidemia Father   . Asthma Father   . Pancreatic cancer Paternal Grandmother   . Healthy Mother     Social History   Tobacco Use  . Smoking status: Never Smoker  . Smokeless tobacco: Never Used  Substance Use Topics  . Alcohol use: No  . Drug use: No    Allergies:  Allergies  Allergen Reactions  . Ciprofloxacin Other (See Comments)    UTI and doesn't work for patient    Medications Prior to Admission  Medication Sig Dispense Refill Last Dose  . acetaminophen (TYLENOL) 500 MG tablet Take 1,000 mg by mouth every 6 (six) hours as needed for mild pain or headache.   Past Week at Unknown time  . cephALEXin (KEFLEX) 500 MG capsule Take 1 capsule (500 mg total) by mouth 2 (two) times daily for 7 days. 14 capsule 0   . Doxylamine-Pyridoxine 10-10 MG TBEC Take 2 tablets by mouth  at bedtime as needed. 60 tablet 0     Review of Systems  Constitutional: Negative for fever.  Gastrointestinal: Positive for abdominal pain.  Genitourinary: Negative for difficulty urinating, dysuria, vaginal bleeding and vaginal discharge.  Musculoskeletal: Negative for back pain.  All other systems reviewed and are negative.  Physical Exam   Blood pressure (!) 142/78, pulse 92, temperature 98.4 F (36.9 C), temperature source Oral, resp. rate 18, weight (!) 141 kg, last menstrual period 04/08/2019, SpO2 99 %.  Physical Exam  Nursing note and vitals reviewed. Constitutional: She is oriented to person, place, and time. She appears well-developed and well-nourished.  Cardiovascular: Normal rate.  Respiratory: Effort normal and breath sounds normal.  GI: Soft.  Neurological: She is alert and oriented to person, place, and time.  Skin: Skin is warm and dry.  Psychiatric: She has a normal mood and affect. Thought content normal.    MAU Course/MDM  Procedures  --vaginal swabs collected by RN via blind swab. Clue cells, no other criteria noted, treatment for BV withheld  Patient Vitals for the past 24 hrs:  BP Temp Temp src Pulse Resp SpO2 Weight  05/19/19 1913 (!) 142/78 98.4 F (36.9 C) Oral 92 18 - -  05/19/19 1912 - - - - - 99 % -  05/19/19 1908 - - - - - - (!) 141 kg   Results for orders placed or performed during the hospital encounter of 05/19/19 (from the past 24 hour(s))  Wet prep, genital     Status: Abnormal   Collection Time: 05/19/19  7:25 PM  Result Value Ref Range   Yeast Wet Prep HPF POC NONE SEEN NONE SEEN   Trich, Wet Prep NONE SEEN NONE SEEN   Clue Cells Wet Prep HPF POC PRESENT (A) NONE SEEN   WBC, Wet Prep HPF POC FEW (A) NONE SEEN   Sperm NONE SEEN   CBC     Status: Abnormal   Collection Time: 05/19/19  8:15 PM  Result Value Ref Range   WBC 11.5 (H) 4.0 - 10.5 K/uL   RBC 4.40 3.87 - 5.11 MIL/uL   Hemoglobin 11.2 (L) 12.0 - 15.0 g/dL   HCT 94.5  85.9 - 29.2 %   MCV 82.5 80.0 - 100.0 fL   MCH 25.5 (L) 26.0 - 34.0 pg   MCHC 30.9 30.0 - 36.0 g/dL   RDW 44.6 28.6 - 38.1 %   Platelets 319 150 - 400 K/uL   nRBC 0.0 0.0 - 0.2 %  ABO/Rh     Status: None (Preliminary result)   Collection Time: 05/19/19  8:15 PM  Result Value Ref Range   ABO/RH(D)      A NEG Performed at [facility], [address], [place], [place] [address]    Antibody Screen PENDING    US Ob Less Than 14 Weeks With Ob Transvaginal  Result Date: 05/19/2019 CLINICAL DATA:  Pelvic pain for 2-3 days with vaginal bleeding and positive pregnancy test EXAM: OBSTETRIC <14 WK [place] AND TRANSVAGINAL OB US TECHNIQUE: Both transabdominal and transvaginal ultrasound examinations were performed for complete evaluation of the gestation as well as the maternal uterus, adnexal regions, and pelvic cul-de-sac. Transvaginal technique was performed to assess early pregnancy. COMPARISON:  None. FINDINGS: Intrauterine gestational sac: Present Yolk sac:  Absent Embryo:  Absent MSD: 4.7 mm mm   5 w   1 d Subchorionic hemorrhage:  None visualized. Maternal uterus/adnexae: Ovaries are within normal limits with a small corpus luteum cyst in the right ovary. IMPRESSION: Probable early intrauterine gestational sac, but no yolk sac, fetal pole, or cardiac activity yet visualized. Recommend follow-up quantitative B-HCG levels and follow-up US in 14 days to assess viability. This recommendation follows SRU consensus guidelines: Diagnostic Criteria for Nonviable Pregnancy Early in the First Trimester. Malva Limes Med 2013; 579:0383-33. Electronically Signed   By: Alcide Clever M.D.   On: 05/19/2019 20:41   Assessment and Plan  --23 y.o. G1P0 with pregnancy of unknown location --Denies abdominal pain throughout time in MAU --Discharge home in stable condition with ectopic/first trimester precautions  F/U: --State Quant Friday at 0900 at [facility], CNM 05/19/2019, 9:24 PM

## 2019-05-19 NOTE — Discharge Instructions (Signed)
Please go to [facility] MAU for further evaluation of abdominal pain Please follow-up with women's clinic for further OB GYN care Begin prenatal vitamin Read attached on prenatal care Diclegis: Two tablets at bedtime on day 1 and 2; if symptoms persist, take 1 tablet in morning and 2 tablets at bedtime on day 3; if symptoms persist, may increase to 1 tablet in morning, 1 tablet mid-afternoon, and 2 tablets at bedtime on day 4 (maximum: doxylamine 40 mg/pyridoxine 40 mg (4 tablets) per day). OR One-half of the 25 mg Unisom sleep tablet over-the-counter tablet or two chewable 5 mg tablets can be used off-label as an antiemetic. In addition, pyridoxine 25 mg, also available over-the-counter, is taken three or four times per day;This is a reasonable, less expensive substitute for combination tablets.  Please begin taking Keflex twice daily for the next week to treat UTI.  Please drink plenty of fluids. Follow-up if symptoms not resolving or worsening

## 2019-05-19 NOTE — Discharge Instructions (Signed)
First Trimester of Pregnancy ° °The first trimester of pregnancy is from week 1 until the end of week 13 (months 1 through 3). During this time, your baby will begin to develop inside you. At 6-8 weeks, the eyes and face are formed, and the heartbeat can be seen on ultrasound. At the end of 12 weeks, all the baby's organs are formed. Prenatal care is all the medical care you receive before the birth of your baby. Make sure you get good prenatal care and follow all of your doctor's instructions. °Follow these instructions at home: °Medicines °· Take over-the-counter and prescription medicines only as told by your doctor. Some medicines are safe and some medicines are not safe during pregnancy. °· Take a prenatal vitamin that contains at least 600 micrograms (mcg) of folic acid. °· If you have trouble pooping (constipation), take medicine that will make your stool soft (stool softener) if your doctor approves. °Eating and drinking ° °· Eat regular, healthy meals. °· Your doctor will tell you the amount of weight gain that is right for you. °· Avoid raw meat and uncooked cheese. °· If you feel sick to your stomach (nauseous) or throw up (vomit): °? Eat 4 or 5 small meals a day instead of 3 large meals. °? Try eating a few soda crackers. °? Drink liquids between meals instead of during meals. °· To prevent constipation: °? Eat foods that are high in fiber, like fresh fruits and vegetables, whole grains, and beans. °? Drink enough fluids to keep your pee (urine) clear or pale yellow. °Activity °· Exercise only as told by your doctor. Stop exercising if you have cramps or pain in your lower belly (abdomen) or low back. °· Do not exercise if it is too hot, too humid, or if you are in a place of great height (high altitude). °· Try to avoid standing for long periods of time. Move your legs often if you must stand in one place for a long time. °· Avoid heavy lifting. °· Wear low-heeled shoes. Sit and stand up  straight. °· You can have sex unless your doctor tells you not to. °Relieving pain and discomfort °· Wear a good support bra if your breasts are sore. °· Take warm water baths (sitz baths) to soothe pain or discomfort caused by hemorrhoids. Use hemorrhoid cream if your doctor says it is okay. °· Rest with your legs raised if you have leg cramps or low back pain. °· If you have puffy, bulging veins (varicose veins) in your legs: °? Wear support hose or compression stockings as told by your doctor. °? Raise (elevate) your feet for 15 minutes, 3-4 times a day. °? Limit salt in your food. °Prenatal care °· Schedule your prenatal visits by the twelfth week of pregnancy. °· Write down your questions. Take them to your prenatal visits. °· Keep all your prenatal visits as told by your doctor. This is important. °Safety °· Wear your seat belt at all times when driving. °· Make a list of emergency phone numbers. The list should include numbers for family, friends, the hospital, and police and fire departments. °General instructions °· Ask your doctor for a referral to a local prenatal class. Begin classes no later than at the start of month 6 of your pregnancy. °· Ask for help if you need counseling or if you need help with nutrition. Your doctor can give you advice or tell you where to go for help. °· Do not use hot tubs, steam   rooms, or saunas.  Do not douche or use tampons or scented sanitary pads.  Do not cross your legs for long periods of time.  Avoid all herbs and alcohol. Avoid drugs that are not approved by your doctor.  Do not use any tobacco products, including cigarettes, chewing tobacco, and electronic cigarettes. If you need help quitting, ask your doctor. You may get counseling or other support to help you quit.  Avoid cat litter boxes and soil used by cats. These carry germs that can cause birth defects in the baby and can cause a loss of your baby (miscarriage) or stillbirth.  Visit your dentist.  At home, brush your teeth with a soft toothbrush. Be gentle when you floss. Contact a doctor if:  You are dizzy.  You have mild cramps or pressure in your lower belly.  You have a nagging pain in your belly area.  You continue to feel sick to your stomach, you throw up, or you have watery poop (diarrhea).  You have a bad smelling fluid coming from your vagina.  You have pain when you pee (urinate).  You have increased puffiness (swelling) in your face, hands, legs, or ankles. Get help right away if:  You have a fever.  You are leaking fluid from your vagina.  You have spotting or bleeding from your vagina.  You have very bad belly cramping or pain.  You gain or lose weight rapidly.  You throw up blood. It may look like coffee grounds.  You are around people who have [place] measles, fifth disease, or chickenpox.  You have a very bad headache.  You have shortness of breath.  You have any kind of trauma, such as from a fall or a car accident. Summary  The first trimester of pregnancy is from week 1 until the end of week 13 (months 1 through 3).  To take care of yourself and your unborn baby, you will need to eat healthy meals, take medicines only if your doctor tells you to do so, and do activities that are safe for you and your baby.  Keep all follow-up visits as told by your doctor. This is important as your doctor will have to ensure that your baby is healthy and growing well. This information is not intended to replace advice given to you by your health care provider. Make sure you discuss any questions you have with your health care provider. Document Released: 12/12/2007 Document Revised: 10/16/2018 Document Reviewed: 07/03/2016 Elsevier Patient Education  2020 [place].   Human Chorionic Gonadotropin Test Why am I having this test? A human chorionic gonadotropin (hCG) test is done to determine whether you are pregnant. It can also be used:  To diagnose an  abnormal pregnancy.  To determine whether you have had a failed pregnancy (miscarriage) or are at risk of one. What is being tested? This test checks the level of the human chorionic gonadotropin (hCG) hormone in the blood. This hormone is produced during pregnancy by the cells that form the placenta. The placenta is the organ that grows inside your womb (uterus) to nourish a developing baby. When you are pregnant, hCG can be detected in your blood or urine 7 to 8 days before your missed period. It continues to go up for the first 8-10 weeks of pregnancy. The presence of hCG in your blood can be measured with several different types of tests. You may have:  A urine test. ? Because this hormone is eliminated from your body  by your kidneys, you may have a urine test to find out whether you are pregnant. A home pregnancy test detects whether there is hCG in your urine. ? A urine test only shows whether there is hCG in your urine. It does not measure how much.  A qualitative blood test. ? You may have this type of blood test to find out if you are pregnant. ? This blood test only shows whether there is hCG in your blood. It does not measure how much.  A quantitative blood test. ? This type of blood test measures the amount of hCG in your blood. ? You may have this test to:  Diagnose an abnormal pregnancy.  Check whether you have had a miscarriage.  Determine whether you are at risk of a miscarriage. What kind of sample is taken?     Two kinds of samples may be collected to test for the hCG hormone.  Blood. It is usually collected by inserting a needle into a blood vessel.  Urine. It is usually collected by urinating into a germ-free (sterile) specimen cup. It is best to collect the sample the first time you urinate in the morning. How do I prepare for this test? No preparation is needed for a blood test.  For the urine test:  Let your health care provider know about: ? All medicines  you are taking, including vitamins, herbs, creams, and over-the-counter medicines. ? Any blood in your urine. This may interfere with the result.  Do not drink too much fluid. Drink as you normally would, or as directed by your health care provider. How are the results reported? Depending on the type of test that you have, your test results may be reported as values. Your health care provider will compare your results to normal ranges that were established after testing a large group of people (reference ranges). Reference ranges may vary among labs and hospitals. For this test, common reference ranges that show absence of pregnancy are:  Quantitative hCG blood levels: less than 5 IU/L. Other results will be reported as either positive or negative. For this test, normal results (meaning the absence of pregnancy) are:  Negative for hCG in the urine test.  Negative for hCG in the qualitative blood test. What do the results mean? Urine and qualitative blood test  A negative result could mean: ? That you are not pregnant. ? That the test was done too early in your pregnancy to detect hCG in your blood or urine. If you still have other signs of pregnancy, the test will be repeated.  A positive result means: ? That you are most likely pregnant. Your health care provider may confirm your pregnancy with an imaging study (ultrasound) of your uterus, if needed. Quantitative blood test Results of the quantitative hCG blood test will be interpreted as follows:  Less than 5 IU/L: You are most likely not pregnant.  Greater than 25 IU/L: You are most likely pregnant.  hCG levels that are higher than expected: ? You are pregnant with twins. ? You have abnormal growths in the uterus.  hCG levels that are rising more slowly than expected: ? You have an ectopic pregnancy (also called a tubal pregnancy).  hCG levels that are falling: ? You may be having a miscarriage. Talk with your health care  provider about what your results mean. Questions to ask your health care provider Ask your health care provider, or the department that is doing the test:  When will my  results be ready?  How will I get my results?  What are my treatment options?  What other tests do I need?  What are my next steps? Summary  A human chorionic gonadotropin test is done to determine whether you are pregnant.  When you are pregnant, hCG can be detected in your blood or urine 7 to 8 days before your missed period. It continues to go up for the first 8-10 weeks of pregnancy.  Your hCG level can be measured with different types of tests. You may have a urine test, a qualitative blood test, or a quantitative blood test.  Talk with your health care provider about what your results mean. This information is not intended to replace advice given to you by your health care provider. Make sure you discuss any questions you have with your health care provider. Document Released: 07/27/2004 Document Revised: 05/27/2017 Document Reviewed: 05/27/2017 Elsevier Patient Education  2020 [employer].

## 2019-05-20 NOTE — ED Provider Notes (Signed)
[facility]    CSN: [id number] Arrival date & time: 05/19/19  1651      History   Chief Complaint Chief Complaint  Patient presents with  . Abdominal Pain    HPI Isabella Pena is a 23 y.o. female history of hypertension, bladder neck reconstruction presenting today for evaluation of abdominal pain.  Patient states that over the past week she has had discomfort in her lower abdomen.  Over the past 2 to 3 days it has been worse.  She has had some associated nausea, but denies vomiting.  Denies changes in bowel movements.  She has had some urinary frequency, but denies dysuria.  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  Since she has had less frequent UTIs.  States that this feels slightly different than her typical UTIs.  Notes that her last menstrual cycle was 9/30, ended on 10/4.  She is not on any form of birth control.  Cycles are typically regular.  States that she was told from an early age that she would have difficulty with pregnancy due to her surgery as she believes she was told that there was some damage to her uterus.  HPI  Past Medical History:  Diagnosis Date  . Hypertension   . Urinary tract infection     Patient Active Problem List   Diagnosis Date Noted  . Acute myopericarditis 05/05/2016  . Acute UTI   . Chest pain   . UTI (urinary tract infection) 05/03/2016  . Hypokalemia 05/03/2016  . Normocytic anemia 05/03/2016    Past Surgical History:  Procedure Laterality Date  . BLADDER NECK RECONSTRUCTION      OB History    Gravida  1   Para      Term      Preterm      AB      Living        SAB      TAB      Ectopic      Multiple      Live Births               Home Medications    Prior to Admission medications   Medication Sig Start Date End Date Taking? Authorizing Provider  acetaminophen (TYLENOL) 500 MG tablet Take 1,000 mg by mouth every  6 (six) hours as needed for mild pain or headache.    [provider]  Doxylamine-Pyridoxine 10-10 MG TBEC Take 2 tablets by mouth at bedtime as needed. 05/19/19   Shawon Denzer, Elesa Hacker, PA-C    Family History Family History  Problem Relation Age of Onset  . Hyperlipidemia Father   . Asthma Father   . Pancreatic cancer Paternal Grandmother   . Healthy Mother     Social History Social History   Tobacco Use  . Smoking status: Never Smoker  . Smokeless tobacco: Never Used  Substance Use Topics  . Alcohol use: No  . Drug use: No     Allergies   Ciprofloxacin   Review of Systems Review of Systems  Constitutional: Negative for fever.  Respiratory: Negative for shortness of breath.   Cardiovascular: Negative for chest pain.  Gastrointestinal: Positive for abdominal pain and nausea. Negative for diarrhea and vomiting.  Genitourinary: Positive for frequency and menstrual problem. Negative for dysuria, flank pain, genital sores, hematuria, vaginal bleeding, vaginal discharge and vaginal pain.  Musculoskeletal: Negative for back  pain.  Skin: Negative for rash.  Neurological: Negative for dizziness, light-headedness and headaches.     Physical Exam Triage Vital Signs ED Triage Vitals  Enc Vitals Group     BP 05/19/19 1738 (!) 145/101     Pulse Rate 05/19/19 1738 89     Resp 05/19/19 1738 16     Temp 05/19/19 1738 98.9 F (37.2 C)     Temp Source 05/19/19 1738 Oral     SpO2 05/19/19 1738 99 %     Weight --      Height --      Head Circumference --      Peak Flow --      Pain Score 05/19/19 1736 6     Pain Loc --      Pain Edu? --      Excl. in GC? --    No data found.  Updated Vital Signs BP (!) 145/101 (BP Location: Left Wrist) Comment (BP Location): reading taken on left wrist  Pulse 89   Temp 98.9 F (37.2 C) (Oral)   Resp 16   LMP 04/08/2019   SpO2 99%   Visual Acuity Right Eye Distance:   Left Eye Distance:   Bilateral Distance:    Right Eye  Near:   Left Eye Near:    Bilateral Near:     Physical Exam Vitals signs and nursing note reviewed.  Constitutional:      General: She is not in acute distress.    Appearance: She is well-developed.  HENT:     Head: Normocephalic and atraumatic.  Eyes:     Conjunctiva/sclera: Conjunctivae normal.  Neck:     Musculoskeletal: Neck supple.  Cardiovascular:     Rate and Rhythm: Normal rate and regular rhythm.     Heart sounds: No murmur.  Pulmonary:     Effort: Pulmonary effort is normal. No respiratory distress.     Breath sounds: Normal breath sounds.     Comments: Breathing comfortably at rest, CTABL, no wheezing, rales or other adventitious sounds auscultated  Abdominal:     Palpations: Abdomen is soft.     Tenderness: There is no abdominal tenderness.     Comments: Soft, nondistended, nontender to bilateral upper quadrants, mild tenderness to lower abdomen, more focal tenderness to palpation of mid lower abdomen/suprapubic area  Skin:    General: Skin is warm and dry.  Neurological:     Mental Status: She is alert.      UC Treatments / Results  Labs (all labs ordered are listed, but only abnormal results are displayed) Labs Reviewed  POCT URINALYSIS DIP (DEVICE) - Abnormal; Notable for the following components:      Result Value   Hgb urine dipstick MODERATE (*)    Protein, ur 30 (*)    Nitrite POSITIVE (*)    Leukocytes,Ua LARGE (*)    All other components within normal limits  POCT PREGNANCY, URINE - Abnormal; Notable for the following components:   Preg Test, Ur POSITIVE (*)    All other components within normal limits  URINE CULTURE  POC URINE PREG, ED    EKG   Radiology US Ob Less Than 14 Weeks With Ob Transvaginal  Result Date: 05/19/2019 CLINICAL DATA:  Pelvic pain for 2-3 days with vaginal bleeding and positive pregnancy test EXAM: OBSTETRIC <14 WK [place] AND TRANSVAGINAL OB US TECHNIQUE: Both transabdominal and transvaginal ultrasound examinations  were performed for complete evaluation of the gestation as well as the maternal  uterus, adnexal regions, and pelvic cul-de-sac. Transvaginal technique was performed to assess early pregnancy. COMPARISON:  None. FINDINGS: Intrauterine gestational sac: Present Yolk sac:  Absent Embryo:  Absent MSD: 4.7 mm mm   5 w   1 d Subchorionic hemorrhage:  None visualized. Maternal uterus/adnexae: Ovaries are within normal limits with a small corpus luteum cyst in the right ovary. IMPRESSION: Probable early intrauterine gestational sac, but no yolk sac, fetal pole, or cardiac activity yet visualized. Recommend follow-up quantitative B-HCG levels and follow-up US in 14 days to assess viability. This recommendation follows SRU consensus guidelines: Diagnostic Criteria for Nonviable Pregnancy Early in the First Trimester. Malva Limes Engl J Med 2013; 161:0960-45; 369:1443-51. Electronically Signed   By: Alcide CleverMark  Lukens M.D.   On: 05/19/2019 20:41    Procedures Procedures (including critical care time)  Medications Ordered in UC Medications - No data to display  Initial Impression / Assessment and Plan / UC Course  I have reviewed the triage vital signs and the nursing notes.  Pertinent labs & imaging results that were available during my care of the patient were reviewed by me and considered in my medical decision making (see chart for details).     UA consistent with UTI.  Patient also positive for pregnancy.  Given this we will go ahead and initiate on Keflex, culture sent.  Given pain and associated pregnancy will have follow-up in MAU to rule out ectopic.  Pain may be more related to UTI, but given history of previous surgery this may make ectopic more likely for patient.  Patient verbalized understanding.  Also recommended initiating prenatal vitamin, provided education on prenatal care.  Provided Diclegis to use as needed for nausea.  Follow-up with women's clinic for outpatient/follow-up care of pregnancy.  Discussed strict return  precautions. Patient verbalized understanding and is agreeable with plan.  Final Clinical Impressions(s) / UC Diagnoses   Final diagnoses:  Lower abdominal pain  Lower urinary tract infectious disease  Positive pregnancy test     Discharge Instructions     Please go to [facility] MAU for further evaluation of abdominal pain Please follow-up with women's clinic for further OB GYN care Begin prenatal vitamin Read attached on prenatal care Diclegis: Two tablets at bedtime on day 1 and 2; if symptoms persist, take 1 tablet in morning and 2 tablets at bedtime on day 3; if symptoms persist, may increase to 1 tablet in morning, 1 tablet mid-afternoon, and 2 tablets at bedtime on day 4 (maximum: doxylamine 40 mg/pyridoxine 40 mg (4 tablets) per day). OR One-half of the 25 mg Unisom sleep tablet over-the-counter tablet or two chewable 5 mg tablets can be used off-label as an antiemetic. In addition, pyridoxine 25 mg, also available over-the-counter, is taken three or four times per day;This is a reasonable, less expensive substitute for combination tablets.  Please begin taking Keflex twice daily for the next week to treat UTI.  Please drink plenty of fluids. Follow-up if symptoms not resolving or worsening   ED Prescriptions    Medication Sig Dispense Auth. Provider   cephALEXin (KEFLEX) 500 MG capsule Take 1 capsule (500 mg total) by mouth 2 (two) times daily for 7 days. 14 capsule Troi Florendo C, PA-C   Doxylamine-Pyridoxine 10-10 MG TBEC Take 2 tablets by mouth at bedtime as needed. 60 tablet Keyvin Rison, [place]lak[place]la[place]ie C, PA-C     PDMP not reviewed this encounter.   Lew DawesWieters, Stefano Trulson C, [place]PA-C 05/20/19 [phone]

## 2019-05-21 LAB — GC/CHLAMYDIA PROBE AMP (~~LOC~~) NOT AT ARMC
Chlamydia: NEGATIVE
Comment: NEGATIVE
Comment: NORMAL
Neisseria Gonorrhea: NEGATIVE

## 2019-05-22 ENCOUNTER — Inpatient Hospital Stay (HOSPITAL_COMMUNITY)
Admission: AD | Admit: 2019-05-22 | Discharge: 2019-05-22 | Disposition: A | Discharge status: Home / Self Care | Payer: Medicaid Other | Attending: Obstetrics and Gynecology | Admitting: Obstetrics and Gynecology

## 2019-05-22 ENCOUNTER — Ambulatory Visit: Payer: Medicaid Other

## 2019-05-22 ENCOUNTER — Other Ambulatory Visit: Payer: Self-pay

## 2019-05-22 DIAGNOSIS — O26891 Other specified pregnancy related conditions, first trimester: Secondary | ICD-10-CM | POA: Insufficient documentation

## 2019-05-22 DIAGNOSIS — Z3A01 Less than 8 weeks gestation of pregnancy: Secondary | ICD-10-CM | POA: Insufficient documentation

## 2019-05-22 DIAGNOSIS — O3680X0 Pregnancy with inconclusive fetal viability, not applicable or unspecified: Secondary | ICD-10-CM

## 2019-05-22 LAB — HCG, QUANTITATIVE, PREGNANCY: hCG, Beta Chain, Quant, S: 6628 m[IU]/mL — ABNORMAL HIGH (ref <5)

## 2019-05-22 NOTE — MAU Provider Note (Signed)
Ms. Isabella Pena  is a 23 y.o. G1P0  at [redacted]w[redacted]d who presents to MAU today for follow-up quant hCG. The patient denies abdominal pain, vaginal bleeding, N/V or fever.   BP 127/72 (BP Location: Right Arm)   Pulse 98   Temp 98.1 F (36.7 C)   Resp 18   LMP 04/08/2019   SpO2 99%   GENERAL: Well-developed, well-nourished female in no acute distress.  HEENT: Normocephalic, atraumatic.   LUNGS: Effort normal HEART: Regular rate  SKIN: Warm, dry and without erythema PSYCH: Normal mood and affect   A: Appropriate rise in quant hCG after 48 hours Component     Latest Ref Rng & Units 05/19/2019 05/22/2019  HCG, Beta Chain, Quant, S     <5 mIU/mL 3,275 (H) 6,628 (H)    P: Discharge home Bleeding/Ectopic precautions discussed Follow-up US ordered for 1 week Patient may return to MAU as needed or if her condition were to change or worsen   Jorje Guild, NP  05/22/2019 12:02 PM

## 2019-05-22 NOTE — Discharge Instructions (Signed)
Return to care   If you have heavier bleeding that soaks through more that 2 pads per hour for an hour or more  If you bleed so much that you feel like you might pass out or you do pass out  If you have significant abdominal pain that is not improved with Tylenol   If you develop a fever > 100.5     [facility] Providers   [facility]       Phone: [phone]  [facility] [place] Phone: [phone]  [facility] [place]  Phone: [phone]  [facility] [place]  Phone: [phone]  [facility] [place]  Phone: [phone]  [facility]       Phone: [phone]  [facility]    Phone: [phone]   [facility]    Phone: [phone]  [facility]    Phone: [phone]  [facility]    Phone: [phone]   [facility]  Phone: [phone]  [facility]    Phone: [phone]  [facility]   Phone: [phone]  [facility]    Phone: [phone]  

## 2019-05-22 NOTE — MAU Note (Signed)
"  tired and cramping, just a little".  No bleeding.

## 2019-05-23 LAB — URINE CULTURE: Culture: >=100000 — AB

## 2019-06-03 ENCOUNTER — Ambulatory Visit (INDEPENDENT_AMBULATORY_CARE_PROVIDER_SITE_OTHER): Payer: Self-pay

## 2019-06-03 ENCOUNTER — Encounter: Payer: Self-pay | Admitting: Family Medicine

## 2019-06-03 ENCOUNTER — Ambulatory Visit (HOSPITAL_COMMUNITY)
Admission: RE | Admit: 2019-06-03 | Discharge: 2019-06-03 | Disposition: A | Discharge status: Home / Self Care | Payer: Medicaid Other | Source: Ambulatory Visit | Attending: Student | Admitting: Student

## 2019-06-03 ENCOUNTER — Other Ambulatory Visit: Payer: Self-pay

## 2019-06-03 DIAGNOSIS — O3680X0 Pregnancy with inconclusive fetal viability, not applicable or unspecified: Secondary | ICD-10-CM

## 2019-06-03 NOTE — Progress Notes (Signed)
Pt here today for OB US results.  Notified Dr. Roselie Awkward results.  Provider recommendation to start [facility].  Pt informed that she has a viable pregnancy with EDD 01/25/20, 6w 2d today, and FHR 130 bpm.  [place] pics given to pt.  Pt denies any pain or bleeding.  Medication and allergies reviewed.  Pt encouraged to start taking PNV.  List of medicines safe to take in pregnancy given to pt.  Proof of pregnancy letter provided by the front office to start [facility].  Pt verbalized understanding.    Mel Almond, RN 06/03/19

## 2019-07-20 DIAGNOSIS — Z3491 Encounter for supervision of normal pregnancy, unspecified, first trimester: Secondary | ICD-10-CM | POA: Diagnosis not present

## 2019-07-28 DIAGNOSIS — Z3400 Encounter for supervision of normal first pregnancy, unspecified trimester: Secondary | ICD-10-CM | POA: Diagnosis not present

## 2019-08-02 ENCOUNTER — Encounter (HOSPITAL_COMMUNITY): Payer: Self-pay | Admitting: Family Medicine

## 2019-08-02 ENCOUNTER — Other Ambulatory Visit: Payer: Self-pay

## 2019-08-02 ENCOUNTER — Inpatient Hospital Stay (HOSPITAL_COMMUNITY)
Admission: AD | Admit: 2019-08-02 | Discharge: 2019-08-02 | Disposition: A | Discharge status: Home / Self Care | Payer: Medicaid Other | Attending: Family Medicine | Admitting: Family Medicine

## 2019-08-02 DIAGNOSIS — R310 Gross hematuria: Secondary | ICD-10-CM

## 2019-08-02 DIAGNOSIS — Z800 Family history of malignant neoplasm of digestive organs: Secondary | ICD-10-CM | POA: Insufficient documentation

## 2019-08-02 DIAGNOSIS — O10912 Unspecified pre-existing hypertension complicating pregnancy, second trimester: Secondary | ICD-10-CM | POA: Diagnosis not present

## 2019-08-02 DIAGNOSIS — O209 Hemorrhage in early pregnancy, unspecified: Secondary | ICD-10-CM | POA: Insufficient documentation

## 2019-08-02 DIAGNOSIS — Z3A15 15 weeks gestation of pregnancy: Secondary | ICD-10-CM | POA: Diagnosis not present

## 2019-08-02 DIAGNOSIS — O2342 Unspecified infection of urinary tract in pregnancy, second trimester: Secondary | ICD-10-CM | POA: Diagnosis not present

## 2019-08-02 DIAGNOSIS — O26832 Pregnancy related renal disease, second trimester: Secondary | ICD-10-CM | POA: Insufficient documentation

## 2019-08-02 DIAGNOSIS — Z881 Allergy status to other antibiotic agents status: Secondary | ICD-10-CM | POA: Diagnosis not present

## 2019-08-02 LAB — URINALYSIS, ROUTINE W REFLEX MICROSCOPIC
Bilirubin Urine: NEGATIVE
Glucose, UA: NEGATIVE mg/dL
Ketones, ur: NEGATIVE mg/dL
Nitrite: NEGATIVE
Protein, ur: 100 mg/dL — AB
RBC / HPF: >50 RBC/hpf — ABNORMAL HIGH (ref 0–5)
Specific Gravity, Urine: 1.014 (ref 1.005–1.030)
WBC, UA: >50 WBC/hpf — ABNORMAL HIGH (ref 0–5)
pH: 6 (ref 5.0–8.0)

## 2019-08-02 MED ORDER — CEPHALEXIN 500 MG PO CAPS: 500.0000 mg | ORAL_CAPSULE | Freq: Four times a day (QID) | ORAL | 0 refills | Status: AC

## 2019-08-02 NOTE — Discharge Instructions (Signed)
Pregnancy and Urinary Tract Infection ° °A urinary tract infection (UTI) is an infection of any part of the urinary tract. This includes the kidneys, the tubes that connect your kidneys to your bladder (ureters), the bladder, and the tube that carries urine out of your body (urethra). These organs make, store, and get rid of urine in the body. Your health care provider may use other names to describe the infection. An upper UTI affects the ureters and kidneys (pyelonephritis). A lower UTI affects the bladder (cystitis) and urethra (urethritis). °Most urinary tract infections are caused by bacteria in your genital area, around the entrance to your urinary tract (urethra). These bacteria grow and cause irritation and inflammation of your urinary tract. You are more likely to develop a UTI during pregnancy because the physical and hormonal changes your body goes through can make it easier for bacteria to get into your urinary tract. Your growing baby also puts pressure on your bladder and can affect urine flow. It is important to recognize and treat UTIs in pregnancy because of the risk of serious complications for both you and your baby. °How does this affect me? °Symptoms of a UTI include: °· Needing to urinate right away (urgently). °· Frequent urination or passing small amounts of urine frequently. °· Pain or burning with urination. °· Blood in the urine. °· Urine that smells bad or unusual. °· Trouble urinating. °· Cloudy urine. °· Pain in the abdomen or lower back. °· Vaginal discharge. °You may also have: °· Vomiting or a decreased appetite. °· Confusion. °· Irritability or tiredness. °· A fever. °· Diarrhea. °How does this affect my baby? °An untreated UTI during pregnancy could lead to a kidney infection or a systemic infection, which can cause health problems that could affect your baby. Possible complications of an untreated UTI include: °· Giving birth to your baby before 37 weeks of pregnancy  (premature). °· Having a baby with a low birth weight. °· Developing high blood pressure during pregnancy (preeclampsia). °· Having a low hemoglobin level (anemia). °What can I do to lower my risk? °To prevent a UTI: °· Go to the bathroom as soon as you feel the need. Do not hold urine for long periods of time. °· Always wipe from front to back, especially after a bowel movement. Use each tissue one time when you wipe. °· Empty your bladder after sex. °· Keep your genital area dry. °· Drink 6-10 glasses of water each day. °· Do not douche or use deodorant sprays. °How is this treated? °Treatment for this condition may include: °· Antibiotic medicines that are safe to take during pregnancy. °· Other medicines to treat less common causes of UTI. °Follow these instructions at home: °· If you were prescribed an antibiotic medicine, take it as told by your health care provider. Do not stop using the antibiotic even if you start to feel better. °· Keep all follow-up visits as told by your health care provider. This is important. °Contact a health care provider if: °· Your symptoms do not improve or they get worse. °· You have abnormal vaginal discharge. °Get help right away if you: °· Have a fever. °· Have nausea and vomiting. °· Have back or side pain. °· Feel contractions in your uterus. °· Have lower belly pain. °· Have a gush of fluid from your vagina. °· Have blood in your urine. °Summary °· A urinary tract infection (UTI) is an infection of any part of the urinary tract, which includes the   kidneys, ureters, bladder, and urethra. °· Most urinary tract infections are caused by bacteria in your genital area, around the entrance to your urinary tract (urethra). °· You are more likely to develop a UTI during pregnancy. °· If you were prescribed an antibiotic medicine, take it as told by your health care provider. Do not stop using the antibiotic even if you start to feel better. °This information is not intended to  replace advice given to you by your health care provider. Make sure you discuss any questions you have with your health care provider. °Document Revised: 10/17/2018 Document Reviewed: 05/29/2018 °Elsevier Patient Education © 2020 Elsevier Inc. ° °

## 2019-08-02 NOTE — MAU Provider Note (Signed)
Chief Complaint: Abdominal Pain, Nausea, Emesis, and Vaginal Bleeding   First Provider Initiated Contact with Patient 08/02/19 1247     SUBJECTIVE HPI: Isabella Pena is a 24 y.o. G1P0 at [redacted]w[redacted]d who presents to Maternity Admissions reporting seeing blood in the toilet after vomiting. Thinks it was in urine, not vaginal bleeding. Alo reports dysuria. 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 States she was Dx'd UTI November 2020, Rx'd Keflex, but didn't take it due to D/C papers saying to discontinue Keflex. On review of chart a Urine culture in Nov 2020 showed Strep Viridans which is not considered pathogenic.   Associated signs and symptoms: Neg for fever, chills, flank pain, abd pain, vaginal discharge.  Has had N/V throughout pregnancy ~2-4 x per day. No exacerbation of N/V.   Past Medical History:  Diagnosis Date  . Hypertension   . Urinary tract infection    OB History  Gravida Para Term Preterm AB Living  1            SAB TAB Ectopic Multiple Live Births               # Outcome Date GA Lbr Len/2nd Weight Sex Delivery Anes PTL Lv  1 Current            Past Surgical History:  Procedure Laterality Date  . BLADDER NECK RECONSTRUCTION     Social History   Socioeconomic History  . Marital status: Single    Spouse name: Not on file  . Number of children: Not on file  . Years of education: Not on file  . Highest education level: Not on file  Occupational History  . Not on file  Tobacco Use  . Smoking status: Never Smoker  . Smokeless tobacco: Never Used  Substance and Sexual Activity  . Alcohol use: No  . Drug use: No  . Sexual activity: Yes  Other Topics Concern  . Not on file  Social History Narrative  . Not on file   Social Determinants of Health   Financial Resource Strain:   . Difficulty of Paying Living Expenses: Not on file  Food Insecurity:   . Worried About Charity fundraiser in the Last Year: Not on file  . Ran Out of Food in the Last Year: Not on  file  Transportation Needs:   . Lack of Transportation (Medical): Not on file  . Lack of Transportation (Non-Medical): Not on file  Physical Activity:   . Days of Exercise per Week: Not on file  . Minutes of Exercise per Session: Not on file  Stress:   . Feeling of Stress : Not on file  Social Connections:   . Frequency of Communication with Friends and Family: Not on file  . Frequency of Social Gatherings with Friends and Family: Not on file  . Attends Religious Services: Not on file  . Active Member of Clubs or Organizations: Not on file  . Attends Archivist Meetings: Not on file  . Marital Status: Not on file  Intimate Partner Violence:   . Fear of Current or Ex-Partner: Not on file  . Emotionally Abused: Not on file  . Physically Abused: Not on file  . Sexually Abused: Not on file   Family History  Problem Relation Age of Onset  . Hyperlipidemia Father   . Asthma Father   . Pancreatic cancer Paternal Grandmother   . Healthy Mother    No current facility-administered medications on file prior to encounter.  Current Outpatient Medications on File Prior to Encounter  Medication Sig Dispense Refill  . Prenatal Vit-Fe Fumarate-FA (MULTIVITAMIN-PRENATAL) 27-0.8 MG TABS tablet Take 1 tablet by mouth daily at 12 noon.    Marland Kitchen acetaminophen (TYLENOL) 500 MG tablet Take 1,000 mg by mouth every 6 (six) hours as needed for mild pain or headache.    . Doxylamine-Pyridoxine 10-10 MG TBEC Take 2 tablets by mouth at bedtime as needed. (Patient not taking: Reported on 06/03/2019) 60 tablet 0   Allergies  Allergen Reactions  . Ciprofloxacin Other (See Comments)    UTI and doesn't work for patient    I have reviewed patient's Past Medical Hx, Surgical Hx, Family Hx, Social Hx, medications and allergies.   Review of Systems  Constitutional: Negative for chills and fever.  Gastrointestinal: Positive for nausea and vomiting. Negative for abdominal pain and diarrhea.   Genitourinary: Positive for dysuria and hematuria. Negative for flank pain, frequency, pelvic pain, urgency, vaginal bleeding and vaginal discharge.  Musculoskeletal: Negative for back pain.    OBJECTIVE Patient Vitals for the past 24 hrs:  BP Temp Temp src Pulse Resp SpO2 Height Weight  08/02/19 1132 133/69 98.7 F (37.1 C) Oral (!) 101 17 98 % - -  08/02/19 1126 - - - - - - 5' (1.524 m) (!) 137.9 kg   Constitutional: Well-developed, well-nourished female in no acute distress.  Cardiovascular: normal rate Respiratory: normal rate and effort.  GI: Abd soft, non-tender, gravid appropriate for gestational age. MS: Extremities nontender, no edema, normal ROM Neurologic: Alert and oriented x 4.  GU: Neg CVAT.  SPECULUM EXAM: NEFG, physiologic discharge, no blood noted, cervix clean  BIMANUAL: cervix closed and thick; uterus 16-week size, no adnexal tenderness or masses. No CMT.  + FHR by Doppler   LAB RESULTS Results for orders placed or performed during the hospital encounter of 08/02/19 (from the past 24 hour(s))  Urinalysis, Routine w reflex microscopic     Status: Abnormal   Collection Time: 08/02/19 11:51 AM  Result Value Ref Range   Color, Urine YELLOW YELLOW   APPearance TURBID (A) CLEAR   Specific Gravity, Urine 1.014 1.005 - 1.030   pH 6.0 5.0 - 8.0   Glucose, UA NEGATIVE NEGATIVE mg/dL   Hgb urine dipstick LARGE (A) NEGATIVE   Bilirubin Urine NEGATIVE NEGATIVE   Ketones, ur NEGATIVE NEGATIVE mg/dL   Protein, ur 469 (A) NEGATIVE mg/dL   Nitrite NEGATIVE NEGATIVE   Leukocytes,Ua MODERATE (A) NEGATIVE   RBC / HPF >50 (H) 0 - 5 RBC/hpf   WBC, UA >50 (H) 0 - 5 WBC/hpf   Bacteria, UA FEW (A) NONE SEEN   Squamous Epithelial / LPF 11-20 0 - 5   WBC Clumps PRESENT    Mucus PRESENT     IMAGING No results found.  MAU COURSE Orders Placed This Encounter  Procedures  . Culture, OB Urine  . Urinalysis, Routine w reflex microscopic  . Discharge patient   Meds ordered  this encounter  Medications  . cephALEXin (KEFLEX) 500 MG capsule    Sig: Take 1 capsule (500 mg total) by mouth 4 (four) times daily for 7 days.    Dispense:  28 capsule    Refill:  0    Order Specific Question:   Supervising Provider    Answer:   Reva Bores [[id number]]    MDM - Hematuria, dysuria w/ pyuria C/W UTI. Rx Keflex. Urine Culture sent. May need UTI prophylaxis if she experiences recurrent UTIs  in pregnancy. No vaginal bleeding on spec exam.    ASSESSMENT 1. UTI (urinary tract infection) during pregnancy, second trimester   2. Gross hematuria     PLAN Discharge home in stable condition. UTI/Pyelo precautions Follow-up Information    Your obstetrician Follow up on 08/07/2019.   Why: as scheduled or sooner as needed if symptoms worsen       [facility] Follow up.   Specialty: Obstetrics and Gynecology Why: as needed in pregnancy emergencies Contact information: [address] [id number] [place] [address] [phone]         Allergies as of 08/02/2019      Reactions   Ciprofloxacin Other (See Comments)   UTI and doesn't work for patient      Medication List    TAKE these medications   acetaminophen 500 MG tablet Commonly known as: TYLENOL Take 1,000 mg by mouth every 6 (six) hours as needed for mild pain or headache.   cephALEXin 500 MG capsule Commonly known as: KEFLEX Take 1 capsule (500 mg total) by mouth 4 (four) times daily for 7 days.   Doxylamine-Pyridoxine 10-10 MG Tbec Take 2 tablets by mouth at bedtime as needed.   multivitamin-prenatal 27-0.8 MG Tabs tablet Take 1 tablet by mouth daily at 12 noon.        Katrinka Blazing, [place], CNM 08/02/2019  1:23 PM

## 2019-08-02 NOTE — MAU Note (Addendum)
Stephaie Dardis is a 24 y.o. at [redacted]w[redacted]d here in MAU reporting: had some vomiting about an hour ago, went to the bathroom and saw some bleeding. Saw a little bit of blood drip into the toilet but saw it mostly when she wiped. No clots. Then started having abdominal pain. Has not had any more vomiting but states she does feel nauseated.   Has been getting care at [facility] in [place]. Was Rx Keflex for UTI at the beginning of November when she was at an urgent care, states she was then sent over to MAU and in her AVS it said to dc the keflex so she never started the medication.  Onset of complaint: an hour ago  Pain score: 8/10  Vitals:   08/02/19 1132  BP: 133/69  Pulse: (!) 101  Resp: 17  Temp: 98.7 F (37.1 C)  SpO2: 98%     Lab orders placed from triage: UA, unable to give sample at this time

## 2019-08-03 LAB — CULTURE, OB URINE: Special Requests: NORMAL

## 2019-08-07 DIAGNOSIS — Z23 Encounter for immunization: Secondary | ICD-10-CM | POA: Diagnosis not present

## 2019-08-07 DIAGNOSIS — R87610 Atypical squamous cells of undetermined significance on cytologic smear of cervix (ASC-US): Secondary | ICD-10-CM | POA: Diagnosis not present

## 2019-08-07 DIAGNOSIS — O2341 Unspecified infection of urinary tract in pregnancy, first trimester: Secondary | ICD-10-CM | POA: Diagnosis not present

## 2019-08-07 DIAGNOSIS — Z3400 Encounter for supervision of normal first pregnancy, unspecified trimester: Secondary | ICD-10-CM | POA: Diagnosis not present

## 2019-08-07 DIAGNOSIS — Z124 Encounter for screening for malignant neoplasm of cervix: Secondary | ICD-10-CM | POA: Diagnosis not present

## 2019-08-07 DIAGNOSIS — Z6841 Body Mass Index (BMI) 40.0 and over, adult: Secondary | ICD-10-CM | POA: Diagnosis not present

## 2019-08-10 DIAGNOSIS — Z3400 Encounter for supervision of normal first pregnancy, unspecified trimester: Secondary | ICD-10-CM | POA: Diagnosis not present

## 2019-08-10 NOTE — Progress Notes (Signed)
Please inform pt that her urine culture shows multiple species. It is not clear that she has a UTI. If she still has symptoms she should call her OB and have the culture repeated being careful to give a clean specimen.

## 2019-08-27 DIAGNOSIS — Z8679 Personal history of other diseases of the circulatory system: Secondary | ICD-10-CM | POA: Diagnosis not present

## 2019-08-27 DIAGNOSIS — Z3A19 19 weeks gestation of pregnancy: Secondary | ICD-10-CM | POA: Diagnosis not present

## 2019-08-27 DIAGNOSIS — O99212 Obesity complicating pregnancy, second trimester: Secondary | ICD-10-CM | POA: Diagnosis not present

## 2019-08-31 DIAGNOSIS — Z3402 Encounter for supervision of normal first pregnancy, second trimester: Secondary | ICD-10-CM | POA: Diagnosis not present

## 2019-09-17 DIAGNOSIS — Z111 Encounter for screening for respiratory tuberculosis: Secondary | ICD-10-CM | POA: Diagnosis not present

## 2019-09-20 DIAGNOSIS — Z111 Encounter for screening for respiratory tuberculosis: Secondary | ICD-10-CM | POA: Diagnosis not present

## 2019-09-20 DIAGNOSIS — Z021 Encounter for pre-employment examination: Secondary | ICD-10-CM | POA: Diagnosis not present

## 2019-10-08 DIAGNOSIS — O99212 Obesity complicating pregnancy, second trimester: Secondary | ICD-10-CM | POA: Diagnosis not present

## 2019-10-08 DIAGNOSIS — Z3A25 25 weeks gestation of pregnancy: Secondary | ICD-10-CM | POA: Diagnosis not present

## 2019-10-28 DIAGNOSIS — Z23 Encounter for immunization: Secondary | ICD-10-CM | POA: Diagnosis not present

## 2019-10-28 DIAGNOSIS — Z3402 Encounter for supervision of normal first pregnancy, second trimester: Secondary | ICD-10-CM | POA: Diagnosis not present

## 2019-10-29 DIAGNOSIS — Z3A28 28 weeks gestation of pregnancy: Secondary | ICD-10-CM | POA: Diagnosis not present

## 2019-10-29 DIAGNOSIS — O99213 Obesity complicating pregnancy, third trimester: Secondary | ICD-10-CM | POA: Diagnosis not present

## 2019-11-03 DIAGNOSIS — Z6791 Unspecified blood type, Rh negative: Secondary | ICD-10-CM | POA: Diagnosis not present

## 2019-11-03 DIAGNOSIS — Z3A28 28 weeks gestation of pregnancy: Secondary | ICD-10-CM | POA: Diagnosis not present

## 2019-11-03 DIAGNOSIS — O26893 Other specified pregnancy related conditions, third trimester: Secondary | ICD-10-CM | POA: Diagnosis not present

## 2019-11-26 DIAGNOSIS — O1213 Gestational proteinuria, third trimester: Secondary | ICD-10-CM | POA: Diagnosis not present

## 2019-11-26 DIAGNOSIS — O99213 Obesity complicating pregnancy, third trimester: Secondary | ICD-10-CM | POA: Diagnosis not present

## 2019-11-26 DIAGNOSIS — O403XX0 Polyhydramnios, third trimester, not applicable or unspecified: Secondary | ICD-10-CM | POA: Diagnosis not present

## 2019-11-26 DIAGNOSIS — Z3A32 32 weeks gestation of pregnancy: Secondary | ICD-10-CM | POA: Diagnosis not present

## 2019-12-04 DIAGNOSIS — O99213 Obesity complicating pregnancy, third trimester: Secondary | ICD-10-CM | POA: Diagnosis not present

## 2019-12-04 DIAGNOSIS — Z3A33 33 weeks gestation of pregnancy: Secondary | ICD-10-CM | POA: Diagnosis not present

## 2019-12-10 DIAGNOSIS — Z3403 Encounter for supervision of normal first pregnancy, third trimester: Secondary | ICD-10-CM | POA: Diagnosis not present

## 2019-12-10 DIAGNOSIS — O1213 Gestational proteinuria, third trimester: Secondary | ICD-10-CM | POA: Diagnosis not present

## 2019-12-10 DIAGNOSIS — O2341 Unspecified infection of urinary tract in pregnancy, first trimester: Secondary | ICD-10-CM | POA: Diagnosis not present

## 2019-12-11 DIAGNOSIS — Z3A34 34 weeks gestation of pregnancy: Secondary | ICD-10-CM | POA: Diagnosis not present

## 2019-12-11 DIAGNOSIS — O99213 Obesity complicating pregnancy, third trimester: Secondary | ICD-10-CM | POA: Diagnosis not present

## 2019-12-11 DIAGNOSIS — E669 Obesity, unspecified: Secondary | ICD-10-CM | POA: Diagnosis not present

## 2019-12-25 DIAGNOSIS — O99213 Obesity complicating pregnancy, third trimester: Secondary | ICD-10-CM | POA: Diagnosis not present

## 2019-12-25 DIAGNOSIS — Z3685 Encounter for antenatal screening for Streptococcus B: Secondary | ICD-10-CM | POA: Diagnosis not present

## 2019-12-25 DIAGNOSIS — Z3A32 32 weeks gestation of pregnancy: Secondary | ICD-10-CM | POA: Diagnosis not present

## 2019-12-25 DIAGNOSIS — Z3403 Encounter for supervision of normal first pregnancy, third trimester: Secondary | ICD-10-CM | POA: Diagnosis not present

## 2020-01-01 DIAGNOSIS — O3663X0 Maternal care for excessive fetal growth, third trimester, not applicable or unspecified: Secondary | ICD-10-CM | POA: Diagnosis not present

## 2020-01-01 DIAGNOSIS — Z6841 Body Mass Index (BMI) 40.0 and over, adult: Secondary | ICD-10-CM | POA: Diagnosis not present

## 2020-01-12 DIAGNOSIS — Z3A38 38 weeks gestation of pregnancy: Secondary | ICD-10-CM | POA: Diagnosis not present

## 2020-01-12 DIAGNOSIS — O99213 Obesity complicating pregnancy, third trimester: Secondary | ICD-10-CM | POA: Diagnosis not present

## 2020-01-13 DIAGNOSIS — O9962 Diseases of the digestive system complicating childbirth: Secondary | ICD-10-CM | POA: Diagnosis not present

## 2020-01-13 DIAGNOSIS — Z7982 Long term (current) use of aspirin: Secondary | ICD-10-CM | POA: Diagnosis not present

## 2020-01-13 DIAGNOSIS — Z881 Allergy status to other antibiotic agents status: Secondary | ICD-10-CM | POA: Diagnosis not present

## 2020-01-13 DIAGNOSIS — Z23 Encounter for immunization: Secondary | ICD-10-CM | POA: Diagnosis not present

## 2020-01-13 DIAGNOSIS — O99214 Obesity complicating childbirth: Secondary | ICD-10-CM | POA: Diagnosis not present

## 2020-01-13 DIAGNOSIS — O99824 Streptococcus B carrier state complicating childbirth: Secondary | ICD-10-CM | POA: Diagnosis not present

## 2020-01-13 DIAGNOSIS — O3663X0 Maternal care for excessive fetal growth, third trimester, not applicable or unspecified: Secondary | ICD-10-CM | POA: Diagnosis not present

## 2020-01-13 DIAGNOSIS — Z3A39 39 weeks gestation of pregnancy: Secondary | ICD-10-CM | POA: Diagnosis not present

## 2020-01-13 DIAGNOSIS — K219 Gastro-esophageal reflux disease without esophagitis: Secondary | ICD-10-CM | POA: Diagnosis not present

## 2020-01-13 DIAGNOSIS — O403XX0 Polyhydramnios, third trimester, not applicable or unspecified: Secondary | ICD-10-CM | POA: Diagnosis not present

## 2020-01-13 DIAGNOSIS — Z6711 Type A blood, Rh negative: Secondary | ICD-10-CM | POA: Diagnosis not present

## 2020-01-13 DIAGNOSIS — O9902 Anemia complicating childbirth: Secondary | ICD-10-CM | POA: Diagnosis not present

## 2020-01-13 DIAGNOSIS — O09893 Supervision of other high risk pregnancies, third trimester: Secondary | ICD-10-CM | POA: Diagnosis not present

## 2020-01-13 DIAGNOSIS — O26893 Other specified pregnancy related conditions, third trimester: Secondary | ICD-10-CM | POA: Diagnosis not present

## 2020-01-13 DIAGNOSIS — O411230 Chorioamnionitis, third trimester, not applicable or unspecified: Secondary | ICD-10-CM | POA: Diagnosis not present

## 2020-01-13 DIAGNOSIS — I252 Old myocardial infarction: Secondary | ICD-10-CM | POA: Diagnosis not present

## 2020-01-13 DIAGNOSIS — D62 Acute posthemorrhagic anemia: Secondary | ICD-10-CM | POA: Diagnosis not present

## 2020-01-15 DIAGNOSIS — O3663X0 Maternal care for excessive fetal growth, third trimester, not applicable or unspecified: Secondary | ICD-10-CM | POA: Diagnosis not present

## 2020-01-15 DIAGNOSIS — Z87898 Personal history of other specified conditions: Secondary | ICD-10-CM | POA: Diagnosis not present

## 2020-01-15 DIAGNOSIS — O403XX0 Polyhydramnios, third trimester, not applicable or unspecified: Secondary | ICD-10-CM | POA: Diagnosis not present

## 2020-01-15 DIAGNOSIS — O99824 Streptococcus B carrier state complicating childbirth: Secondary | ICD-10-CM | POA: Diagnosis not present

## 2020-01-15 DIAGNOSIS — Z3A39 39 weeks gestation of pregnancy: Secondary | ICD-10-CM | POA: Diagnosis not present

## 2020-01-15 DIAGNOSIS — O411230 Chorioamnionitis, third trimester, not applicable or unspecified: Secondary | ICD-10-CM | POA: Diagnosis not present

## 2020-01-15 DIAGNOSIS — O99214 Obesity complicating childbirth: Secondary | ICD-10-CM | POA: Diagnosis not present

## 2020-01-15 DIAGNOSIS — O26893 Other specified pregnancy related conditions, third trimester: Secondary | ICD-10-CM | POA: Diagnosis not present

## 2020-01-15 DIAGNOSIS — Z6791 Unspecified blood type, Rh negative: Secondary | ICD-10-CM | POA: Diagnosis not present

## 2020-03-03 DIAGNOSIS — Z01818 Encounter for other preprocedural examination: Secondary | ICD-10-CM | POA: Diagnosis not present

## 2020-03-03 DIAGNOSIS — Z30017 Encounter for initial prescription of implantable subdermal contraceptive: Secondary | ICD-10-CM | POA: Diagnosis not present

## 2020-03-03 DIAGNOSIS — Z113 Encounter for screening for infections with a predominantly sexual mode of transmission: Secondary | ICD-10-CM | POA: Diagnosis not present

## 2020-07-09 DIAGNOSIS — K2970 Gastritis, unspecified, without bleeding: Secondary | ICD-10-CM

## 2020-07-09 HISTORY — DX: Gastritis, unspecified, without bleeding: K29.70

## 2020-12-21 DIAGNOSIS — R319 Hematuria, unspecified: Secondary | ICD-10-CM | POA: Diagnosis not present

## 2020-12-21 DIAGNOSIS — N390 Urinary tract infection, site not specified: Secondary | ICD-10-CM | POA: Diagnosis not present

## 2020-12-21 DIAGNOSIS — F32A Depression, unspecified: Secondary | ICD-10-CM | POA: Diagnosis not present

## 2020-12-21 DIAGNOSIS — R300 Dysuria: Secondary | ICD-10-CM | POA: Diagnosis not present

## 2021-02-05 DIAGNOSIS — R1013 Epigastric pain: Secondary | ICD-10-CM | POA: Diagnosis not present

## 2021-03-20 ENCOUNTER — Ambulatory Visit: Payer: Self-pay | Admitting: Family

## 2021-03-21 ENCOUNTER — Ambulatory Visit: Payer: Medicaid Other | Admitting: Nurse Practitioner

## 2021-03-26 IMAGING — US US OB < 14 WEEKS - US OB TV
1 series · 15 of 28 positions shown · non-contrast
Comparison: None.

CLINICAL DATA: Pelvic pain for 2-3 days with vaginal bleeding and
positive pregnancy test

EXAM:
OBSTETRIC <14 WK US AND TRANSVAGINAL OB US
TECHNIQUE: Both transabdominal and transvaginal ultrasound examinations were
performed for complete evaluation of the gestation as well as the
maternal uterus, adnexal regions, and pelvic cul-de-sac.
Transvaginal technique was performed to assess early pregnancy.

[Series 1: us ob < 14 weeks - us ob tv · 15 of 34 slices shown]
[im 1/34]
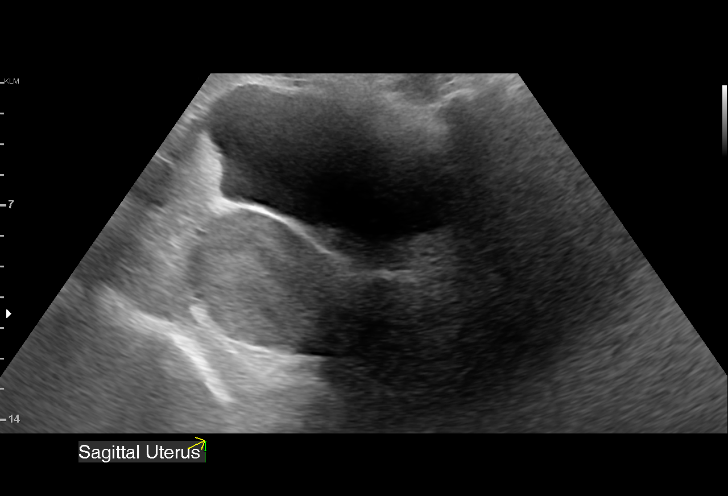
[im 3/34]
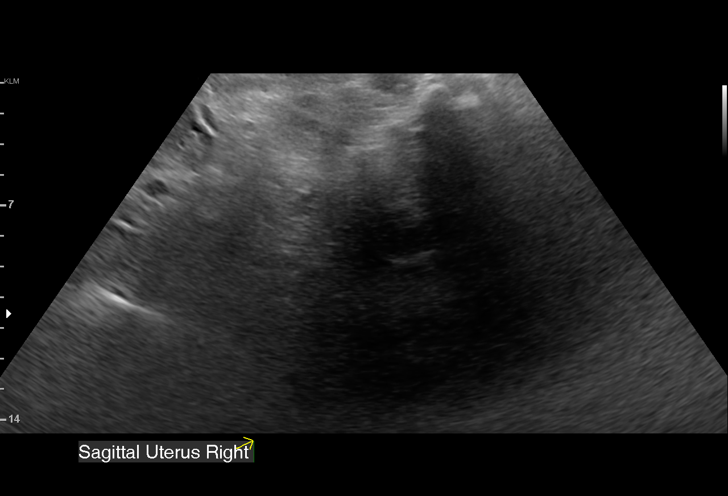
[im 5/34]
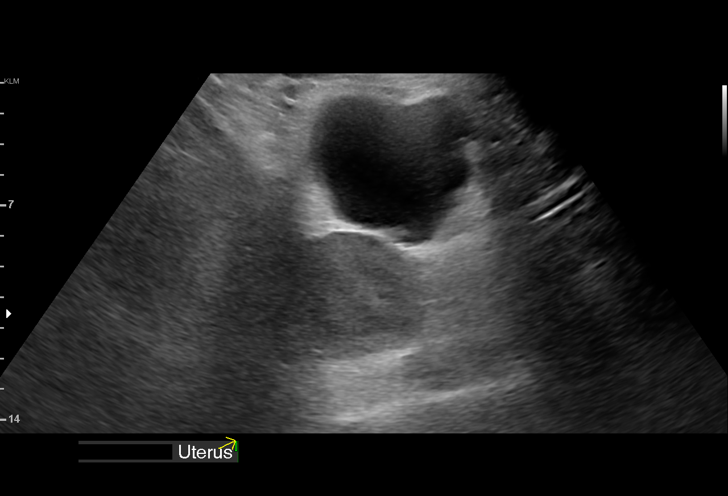
[im 8/34]
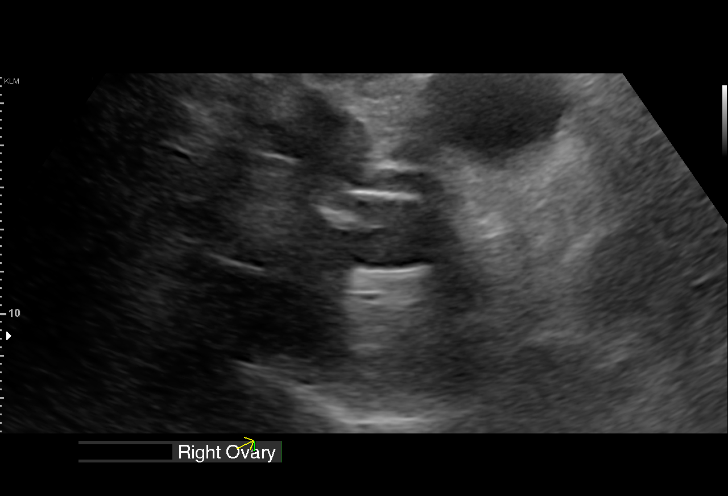
[im 10/34]
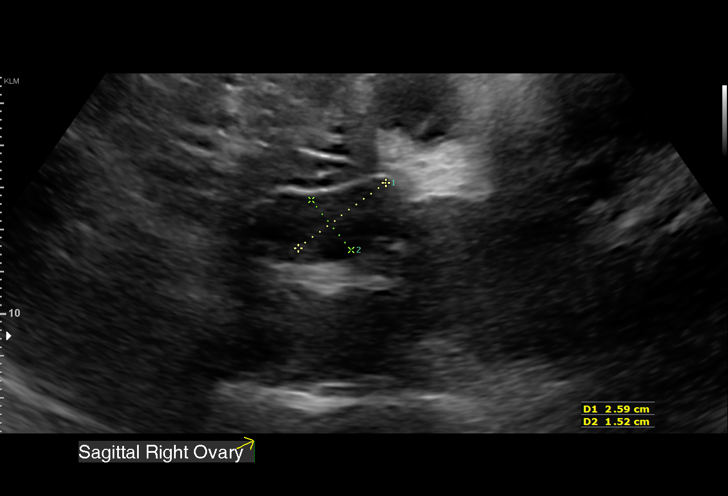
[im 13/34]
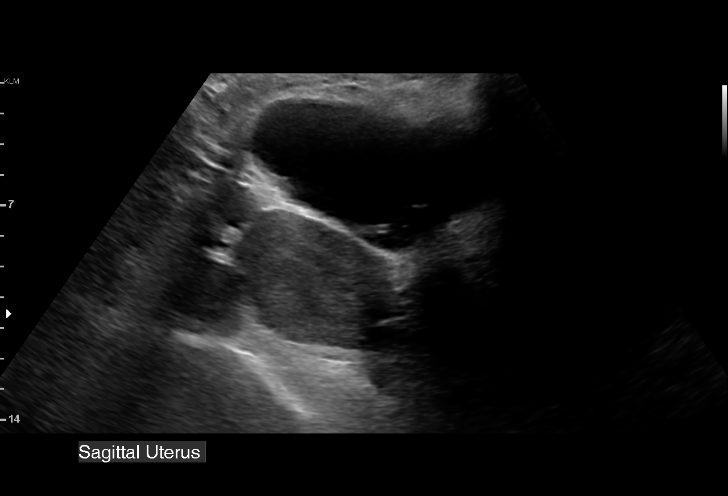
[im 15/34]
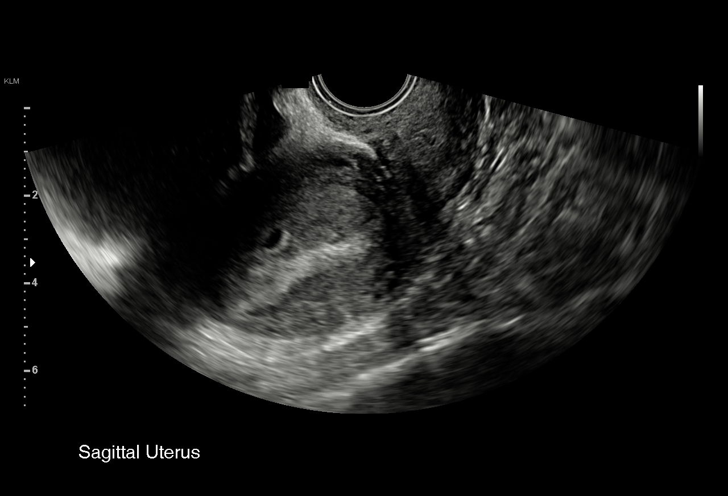
[im 18/34]
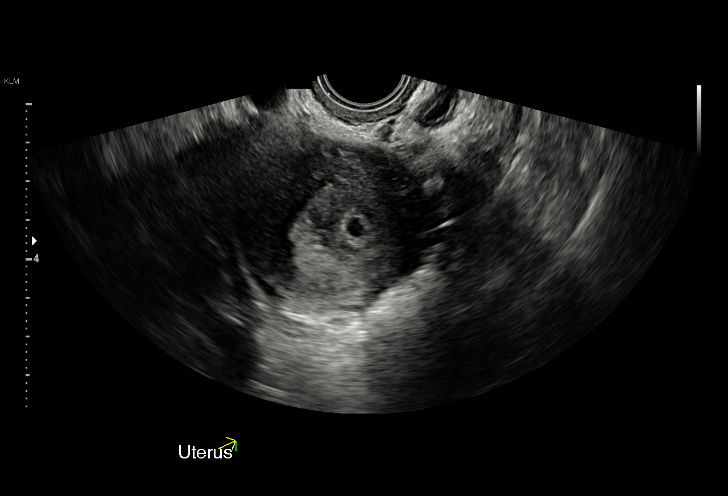
[im 19/34]
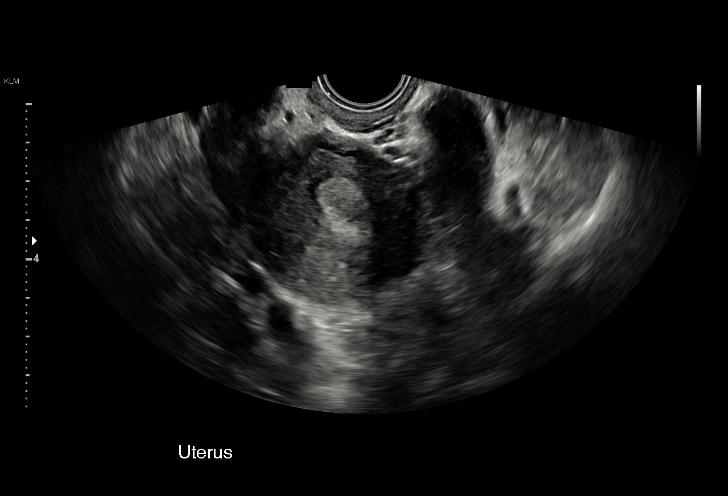
[im 21/34]
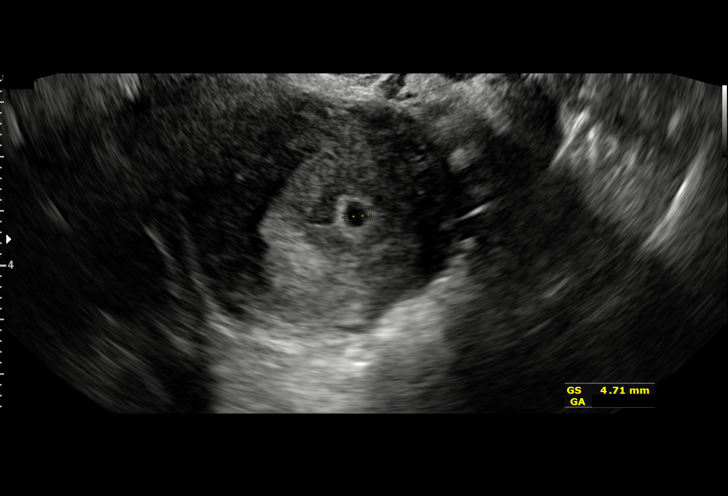
[im 24/34]
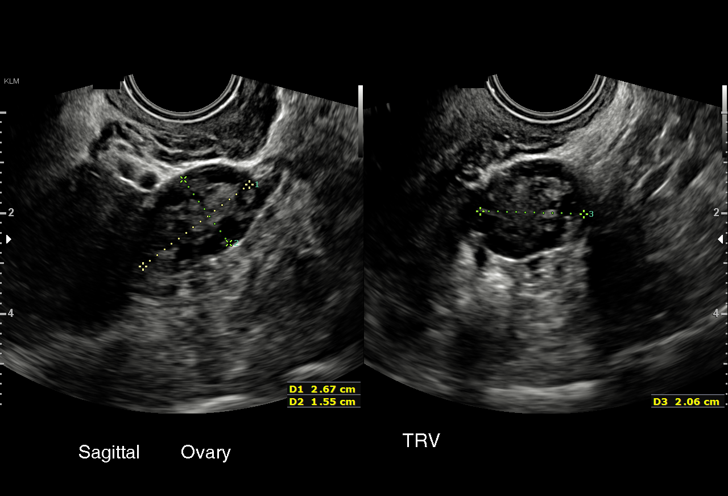
[im 26/34]
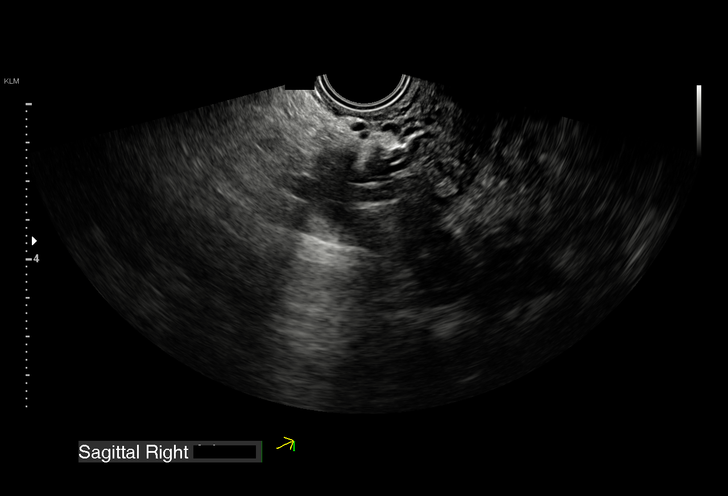
[im 29/34]
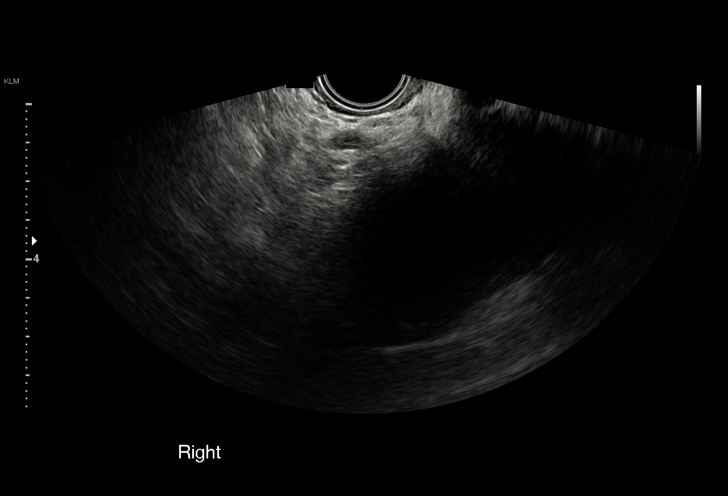
[im 31/34]
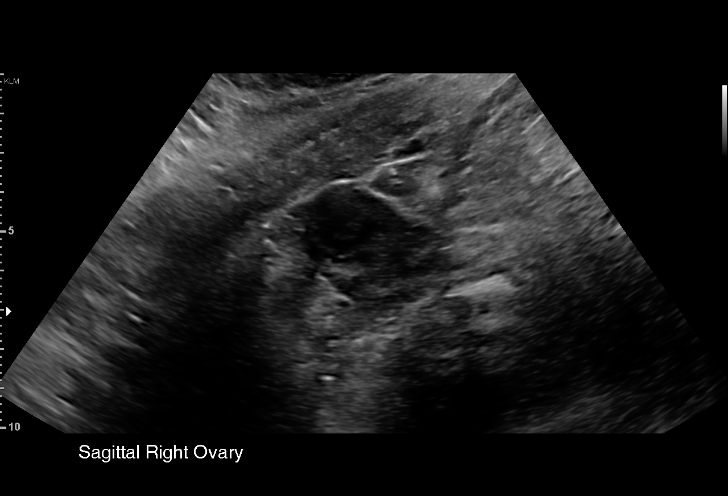
[im 34/34]
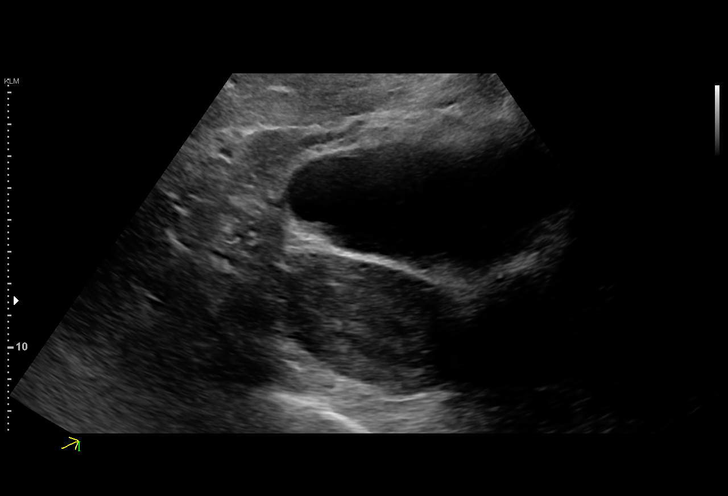

[15 of 28 positions shown; findings below may reference images not displayed]

FINDINGS: Intrauterine gestational sac: Present

Yolk sac:  Absent

Embryo:  Absent

MSD: 4.7 mm mm   5 w   1 d

Subchorionic hemorrhage:  None visualized.

Maternal uterus/adnexae: Ovaries are within normal limits with a
small corpus luteum cyst in the right ovary.
IMPRESSION: Probable early intrauterine gestational sac, but no yolk sac, fetal
pole, or cardiac activity yet visualized. Recommend follow-up
quantitative B-HCG levels and follow-up US in 14 days to assess
viability. This recommendation follows SRU consensus guidelines:
Diagnostic Criteria for Nonviable Pregnancy Early in the First
Trimester. N Engl J Med 4561; [DATE].

## 2021-04-10 IMAGING — US US OB TRANSVAGINAL
1 series · 15 of 28 positions shown · non-contrast
Comparison: May 19, 2019

CLINICAL DATA: Fetal pole not seen on recent obstetrical ultrasound

EXAM:
TRANSVAGINAL OB ULTRASOUND
TECHNIQUE: Transvaginal ultrasound was performed for complete evaluation of the
gestation as well as the maternal uterus, adnexal regions, and
pelvic cul-de-sac.

[Series 1: us ob transvaginal · 15 of 39 slices shown]
[im 1/39]
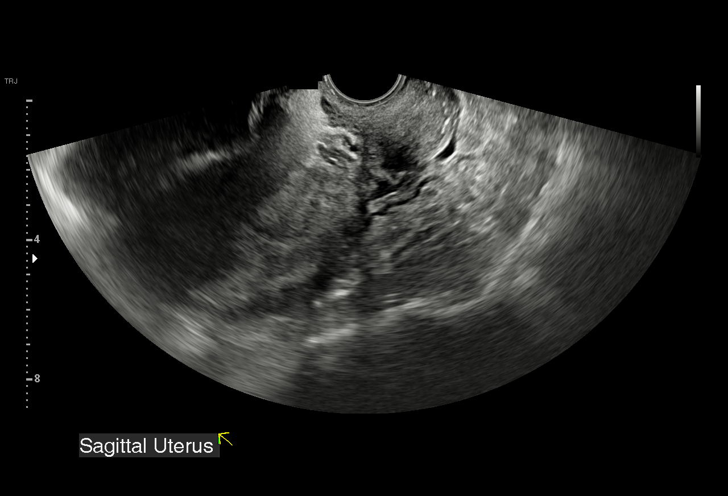
[im 3/39]
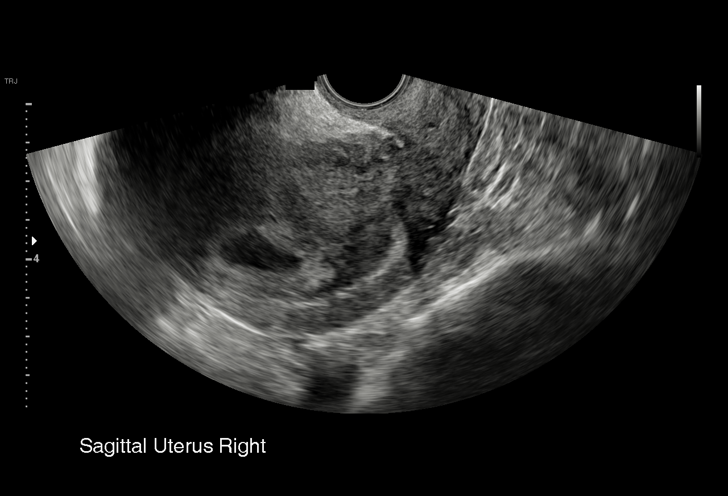
[im 6/39]
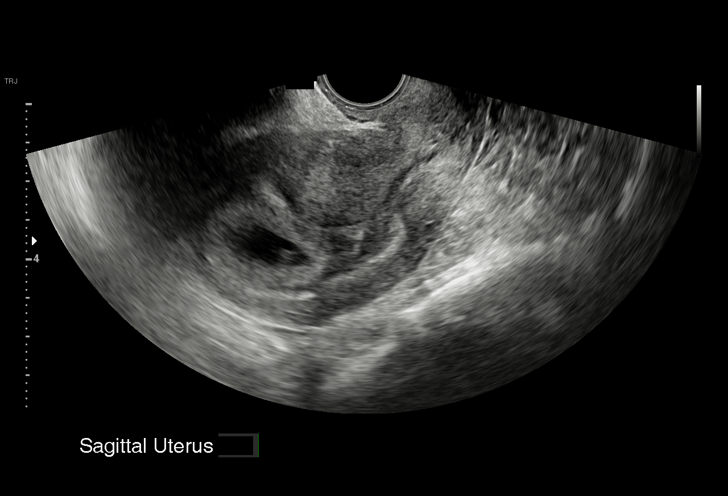
[im 9/39]
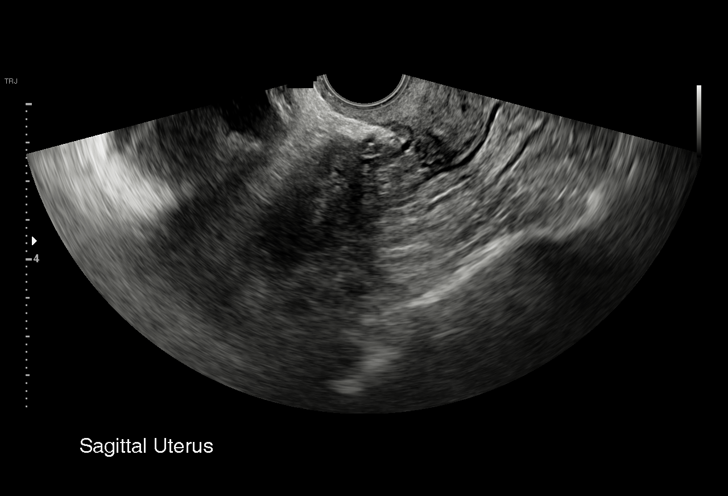
[im 12/39]
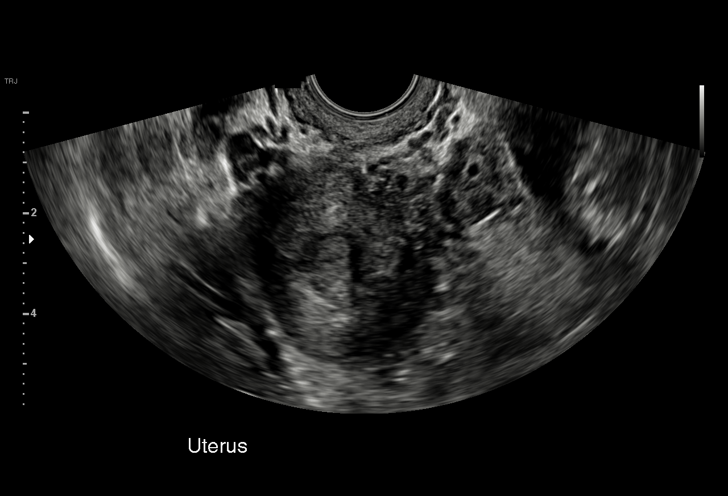
[im 15/39]
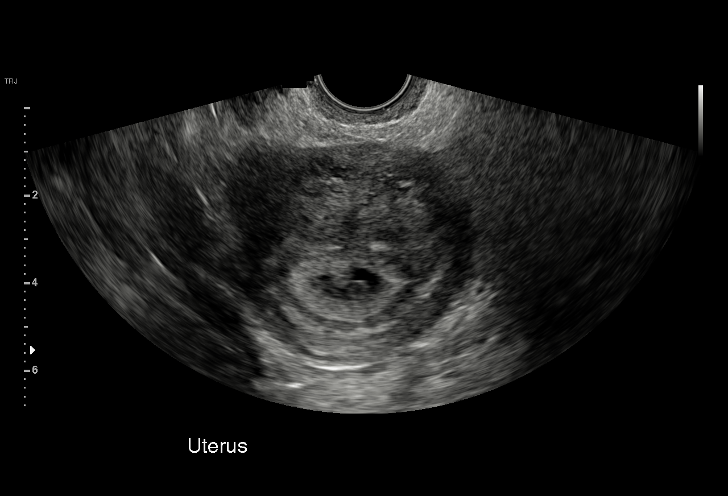
[im 17/39]
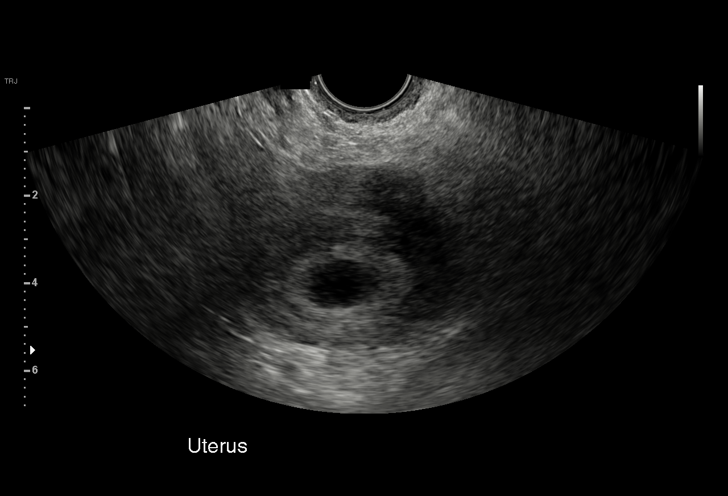
[im 20/39]
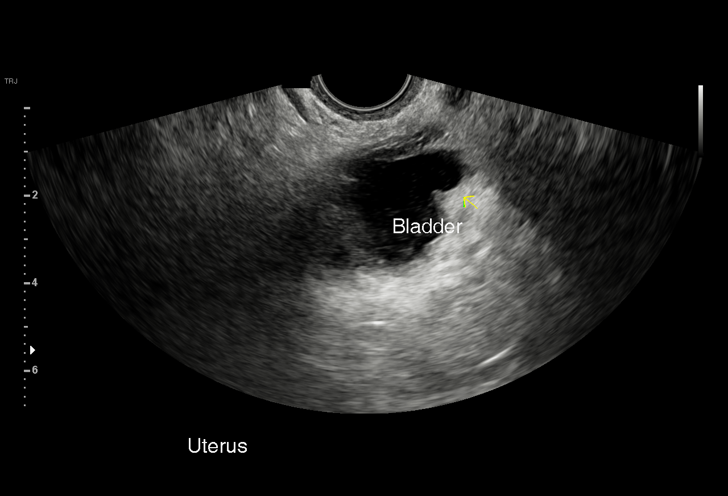
[im 22/39]
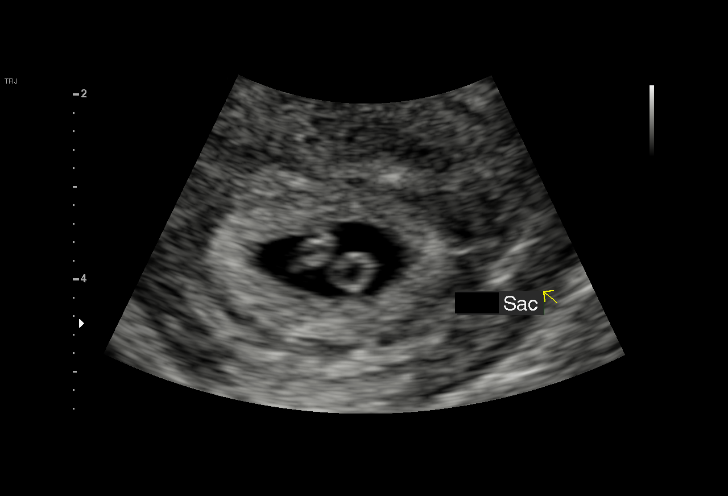
[im 24/39]
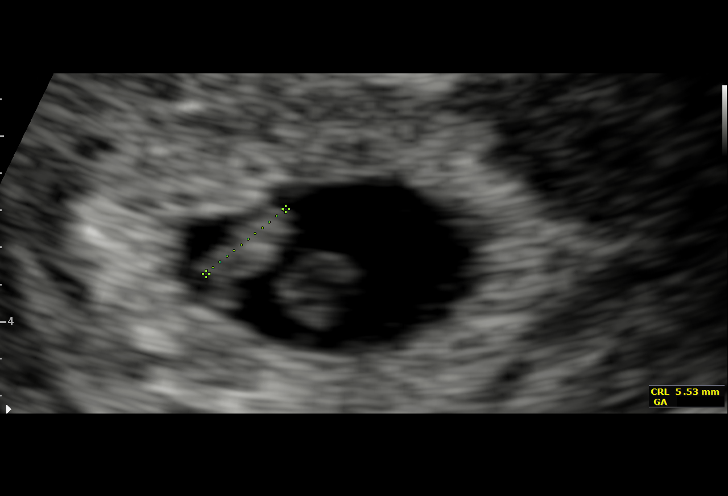
[im 27/39]
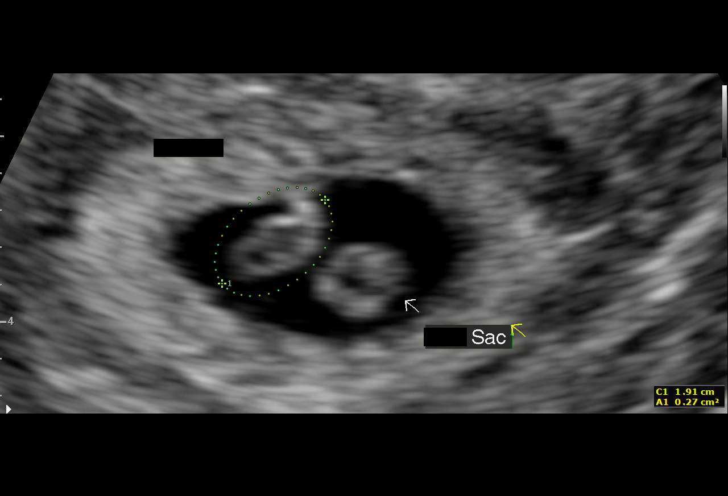
[im 30/39]
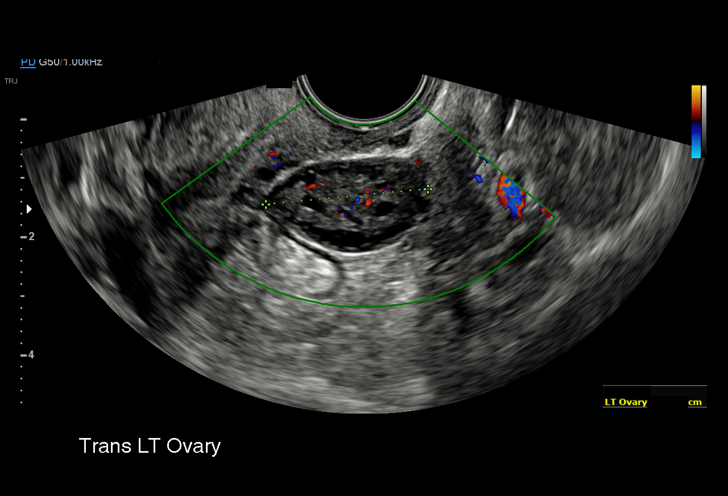
[im 33/39]
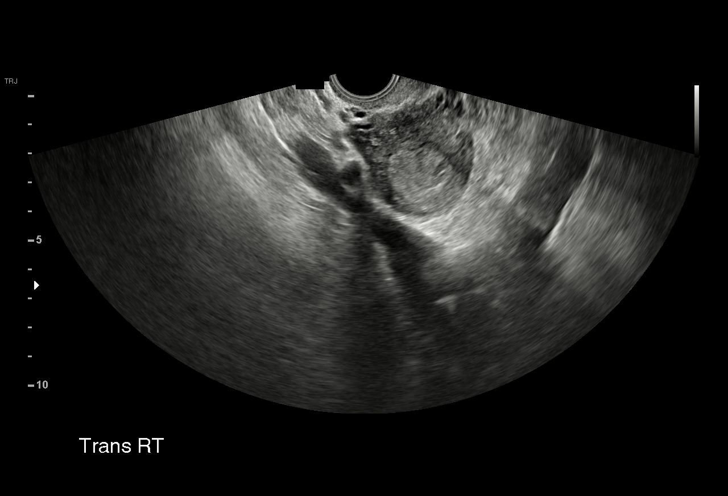
[im 36/39]
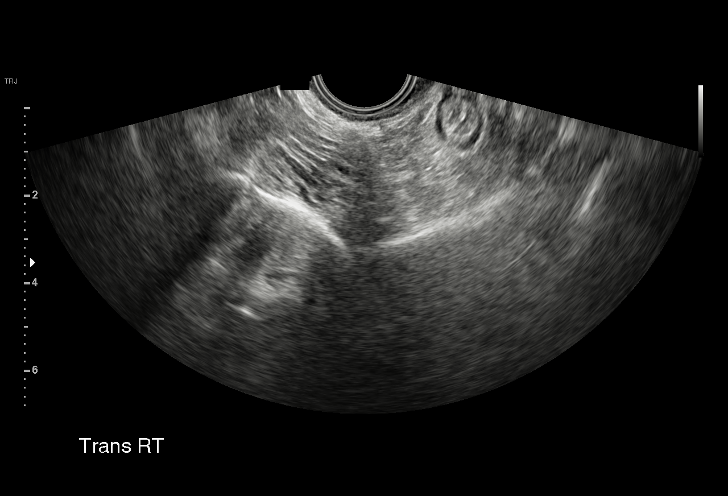
[im 39/39]
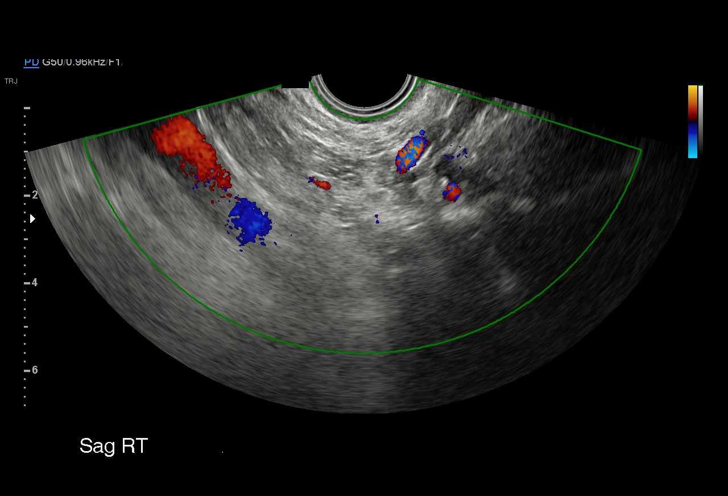

[15 of 28 positions shown; findings below may reference images not displayed]

FINDINGS: Intrauterine gestational sac: Visualized

Yolk sac:  Visualized

Embryo:  Visualized

Cardiac Activity: Visualized

Heart Rate: 130 bpm

CRL:   6 mm   6 w 2 d                  US EDC: January 25, 2020

Subchorionic hemorrhage:  None visualized.

Maternal uterus/adnexae: Cervical os is closed. Right ovary could
not be visualized. No right-sided extrauterine pelvic mass evident.
Left ovary measures 3.1 x 1.7 x 2.8 cm. No left-sided pelvic mass.
No free pelvic fluid.
IMPRESSION: Single live intrauterine gestation with estimated gestational age of
6+ weeks. No subchorionic hemorrhage evident.

Unable to visualize right ovary. No extrauterine pelvic mass or
fluid evident.

## 2021-05-24 ENCOUNTER — Other Ambulatory Visit: Payer: Self-pay

## 2021-05-24 ENCOUNTER — Ambulatory Visit (HOSPITAL_COMMUNITY)
Admission: EM | Admit: 2021-05-24 | Discharge: 2021-05-24 | Disposition: A | Discharge status: Home / Self Care | Payer: Medicaid Other | Attending: Internal Medicine | Admitting: Internal Medicine

## 2021-05-24 ENCOUNTER — Encounter (HOSPITAL_COMMUNITY): Payer: Self-pay | Admitting: Emergency Medicine

## 2021-05-24 DIAGNOSIS — J029 Acute pharyngitis, unspecified: Secondary | ICD-10-CM | POA: Insufficient documentation

## 2021-05-24 LAB — POCT RAPID STREP A, ED / UC: Streptococcus, Group A Screen (Direct): NEGATIVE

## 2021-05-24 MED ORDER — METHYLPREDNISOLONE SODIUM SUCC 125 MG IJ SOLR
INTRAMUSCULAR | Status: AC
  Filled 2021-05-24: qty 2

## 2021-05-24 MED ORDER — METHYLPREDNISOLONE SODIUM SUCC 125 MG IJ SOLR
60.0000 mg | Freq: Once | INTRAMUSCULAR | Status: AC
  Administered 2021-05-24: 60 mg via INTRAMUSCULAR

## 2021-05-24 MED ORDER — AMOXICILLIN 500 MG PO TABS: 500.0000 mg | ORAL_TABLET | Freq: Two times a day (BID) | ORAL | 0 refills | Status: AC

## 2021-05-24 NOTE — ED Provider Notes (Signed)
[facility]    CSN: [id number] Arrival date & time: 05/24/21  1418      History   Chief Complaint Chief Complaint  Patient presents with   Cough   Sore Throat    HPI Isabella Pena is a 25 y.o. female presents to urgent care today with 3-day history of sore throat, cough and body aches.  Patient states temperature up to 100.5 yesterday.  Patient states she is coughing up tonsil stones.  Nausea yesterday but now improved.  Denies any vomiting or diarrhea, no abdominal pain no difficulty breathing  Past Medical History:  Diagnosis Date   Hypertension    Stomach inflammation 2022   Urinary tract infection     Patient Active Problem List   Diagnosis Date Noted   Acute myopericarditis 05/05/2016   Acute UTI    Chest pain    UTI (urinary tract infection) 05/03/2016   Hypokalemia 05/03/2016   Normocytic anemia 05/03/2016    Past Surgical History:  Procedure Laterality Date   BLADDER NECK RECONSTRUCTION      OB History     Gravida  1   Para      Term      Preterm      AB      Living         SAB      IAB      Ectopic      Multiple      Live Births               Home Medications    Prior to Admission medications   Medication Sig Start Date End Date Taking? Authorizing Provider  amoxicillin (AMOXIL) 500 MG tablet Take 1 tablet (500 mg total) by mouth 2 (two) times daily for 10 days. 05/24/21 06/03/21 Yes Rolla Etienne, NP  acetaminophen (TYLENOL) 500 MG tablet Take 1,000 mg by mouth every 6 (six) hours as needed for mild pain or headache.    [provider]  Doxylamine-Pyridoxine 10-10 MG TBEC Take 2 tablets by mouth at bedtime as needed. Patient not taking: Reported on 06/03/2019 05/19/19   Wieters, Fran Lowes C, PA-C  Prenatal Vit-Fe Fumarate-FA (MULTIVITAMIN-PRENATAL) 27-0.8 MG TABS tablet Take 1 tablet by mouth daily at 12 noon.    [provider]    Family History Family History  Problem Relation Age of  Onset   Hyperlipidemia Father    Asthma Father    Pancreatic cancer Paternal Grandmother    Healthy Mother     Social History Social History   Tobacco Use   Smoking status: Never   Smokeless tobacco: Never  Vaping Use   Vaping Use: Never used  Substance Use Topics   Alcohol use: No   Drug use: No     Allergies   Ciprofloxacin   Review of Systems As stated in HPI otherwise negative   Physical Exam Triage Vital Signs ED Triage Vitals [05/24/21 1526]  Enc Vitals Group     BP 125/85     Pulse Rate 92     Resp 16     Temp 99 F (37.2 C)     Temp Source Oral     SpO2 95 %     Weight      Height      Head Circumference      Peak Flow      Pain Score 7     Pain Loc      Pain Edu?  Excl. in GC?    No data found.  Updated Vital Signs BP 125/85 (BP Location: Right Arm)   Pulse 92   Temp 99 F (37.2 C) (Oral)   Resp 16   LMP 05/24/2021 (Exact Date)   SpO2 95%   Breastfeeding No   Visual Acuity Right Eye Distance:   Left Eye Distance:   Bilateral Distance:    Right Eye Near:   Left Eye Near:    Bilateral Near:     Physical Exam Constitutional:      General: She is not in acute distress.    Appearance: She is well-developed. She is not toxic-appearing.     Comments: Appears unwell but in no acute distress  HENT:     Right Ear: Tympanic membrane normal. Tympanic membrane is not erythematous.     Left Ear: Tympanic membrane normal. Tympanic membrane is not erythematous.     Nose: No congestion or rhinorrhea.     Mouth/Throat:     Mouth: Mucous membranes are moist.     Pharynx: Uvula midline. Oropharyngeal exudate present.     Tonsils: Tonsillar exudate and tonsillar abscess present. 3+ on the right. 3+ on the left.  Cardiovascular:     Rate and Rhythm: Normal rate and regular rhythm.  Pulmonary:     Effort: Pulmonary effort is normal.     Breath sounds: Normal breath sounds.  Abdominal:     General: Bowel sounds are normal.     Palpations:  Abdomen is soft.  Musculoskeletal:     Cervical back: Normal range of motion.  Lymphadenopathy:     Cervical: Cervical adenopathy present.  Skin:    General: Skin is warm and dry.  Neurological:     General: No focal deficit present.     Mental Status: She is alert and oriented to person, place, and time.  Psychiatric:        Mood and Affect: Mood normal.        Behavior: Behavior normal.     UC Treatments / Results  Labs (all labs ordered are listed, but only abnormal results are displayed) Labs Reviewed  CULTURE, GROUP A STREP [facility])  POCT RAPID STREP A, ED / UC    EKG   Radiology No results found.  Procedures Procedures (including critical care time)  Medications Ordered in UC Medications  methylPREDNISolone sodium succinate (SOLU-MEDROL) 125 mg/2 mL injection 60 mg (60 mg Intramuscular Given 05/24/21 1619)    Initial Impression / Assessment and Plan / UC Course  I have reviewed the triage vital signs and the nursing notes.  Pertinent labs & imaging results that were available during my care of the patient were reviewed by me and considered in my medical decision making (see chart for details).  Acute pharyngitis -3-day history of fever, body aches and sore throat -Patient with significant tonsillar exudate and 2+ swelling -Despite rapid negative strep we will treat empirically for strep pharyngitis based on exam.  Send throat swab for culture -IM Solu-Medrol x1 in office -Patient to follow-up for any worsening or persistent symptoms.  ER for any difficulty breathing  Reviewed expections re: course of current medical issues. Questions answered. Outlined signs and symptoms indicating need for more acute intervention. Pt verbalized understanding. AVS given   Final Clinical Impressions(s) / UC Diagnoses   Final diagnoses:  Acute pharyngitis, unspecified etiology     Discharge Instructions      I am going ahead and treating you for strep given the  appearance of your tonsils.  Your swab will be sent for culture.  Take medication as prescribed until completely gone.  Please follow-up for worsening or persistent symptoms.     ED Prescriptions     Medication Sig Dispense Auth. Provider   amoxicillin (AMOXIL) 500 MG tablet Take 1 tablet (500 mg total) by mouth 2 (two) times daily for 10 days. 20 tablet Rolla Etienne, NP      PDMP not reviewed this encounter.   Rolla Etienne, NP 05/24/21 [phone]

## 2021-05-24 NOTE — ED Triage Notes (Signed)
Patient c/o productive cough w/ "stones" and sore throat x 3 days.   Patient endorses a temperature of 100.5 F at home.   Patient endorses painful swallowing.   Patient endorses inability to "keep food down".   Patient has tried salt-water gargles with no relief of symptoms.

## 2021-05-24 NOTE — Discharge Instructions (Addendum)
I am going ahead and treating you for strep given the appearance of your tonsils.  Your swab will be sent for culture.  Take medication as prescribed until completely gone.  Please follow-up for worsening or persistent symptoms.

## 2021-05-26 LAB — CULTURE, GROUP A STREP (THRC)

## 2021-06-19 ENCOUNTER — Encounter (HOSPITAL_COMMUNITY): Payer: Self-pay

## 2021-06-19 ENCOUNTER — Other Ambulatory Visit: Payer: Self-pay

## 2021-06-19 ENCOUNTER — Emergency Department (HOSPITAL_COMMUNITY)
Admission: EM | Admit: 2021-06-19 | Discharge: 2021-06-20 | Disposition: A | Discharge status: Home / Self Care | Payer: Medicaid Other | Attending: Emergency Medicine | Admitting: Emergency Medicine

## 2021-06-19 DIAGNOSIS — R7401 Elevation of levels of liver transaminase levels: Secondary | ICD-10-CM | POA: Diagnosis not present

## 2021-06-19 DIAGNOSIS — R1013 Epigastric pain: Secondary | ICD-10-CM

## 2021-06-19 DIAGNOSIS — R112 Nausea with vomiting, unspecified: Secondary | ICD-10-CM | POA: Insufficient documentation

## 2021-06-19 DIAGNOSIS — R7989 Other specified abnormal findings of blood chemistry: Secondary | ICD-10-CM

## 2021-06-19 DIAGNOSIS — Z5321 Procedure and treatment not carried out due to patient leaving prior to being seen by health care provider: Secondary | ICD-10-CM | POA: Diagnosis not present

## 2021-06-19 LAB — URINALYSIS, ROUTINE W REFLEX MICROSCOPIC
Glucose, UA: NEGATIVE mg/dL
Ketones, ur: NEGATIVE mg/dL
Nitrite: NEGATIVE
Protein, ur: NEGATIVE mg/dL
Specific Gravity, Urine: 1.01 (ref 1.005–1.030)
pH: 6 (ref 5.0–8.0)

## 2021-06-19 LAB — CBC WITH DIFFERENTIAL/PLATELET
Abs Immature Granulocytes: 0.04 10*3/uL (ref 0.00–0.07)
Basophils Absolute: 0 10*3/uL (ref 0.0–0.1)
Basophils Relative: 0 %
Eosinophils Absolute: 0.2 10*3/uL (ref 0.0–0.5)
Eosinophils Relative: 2 %
HCT: 44.3 % (ref 36.0–46.0)
Hemoglobin: 14.1 g/dL (ref 12.0–15.0)
Immature Granulocytes: 1 %
Lymphocytes Relative: 34 %
Lymphs Abs: 2.7 10*3/uL (ref 0.7–4.0)
MCH: 28.4 pg (ref 26.0–34.0)
MCHC: 31.8 g/dL (ref 30.0–36.0)
MCV: 89.1 fL (ref 80.0–100.0)
Monocytes Absolute: 0.6 10*3/uL (ref 0.1–1.0)
Monocytes Relative: 7 %
Neutro Abs: 4.6 10*3/uL (ref 1.7–7.7)
Neutrophils Relative %: 56 %
Platelets: 265 10*3/uL (ref 150–400)
RBC: 4.97 MIL/uL (ref 3.87–5.11)
RDW: 13.2 % (ref 11.5–15.5)
WBC: 8.1 10*3/uL (ref 4.0–10.5)
nRBC: 0 % (ref 0.0–0.2)

## 2021-06-19 LAB — URINALYSIS, MICROSCOPIC (REFLEX): RBC / HPF: >50 RBC/hpf (ref 0–5)

## 2021-06-19 LAB — COMPREHENSIVE METABOLIC PANEL
ALT: 158 U/L — ABNORMAL HIGH (ref 0–44)
AST: 217 U/L — ABNORMAL HIGH (ref 15–41)
Albumin: 3.5 g/dL (ref 3.5–5.0)
Alkaline Phosphatase: 148 U/L — ABNORMAL HIGH (ref 38–126)
Anion gap: 5 (ref 5–15)
BUN: 15 mg/dL (ref 6–20)
CO2: 25 mmol/L (ref 22–32)
Calcium: 8.9 mg/dL (ref 8.9–10.3)
Chloride: 108 mmol/L (ref 98–111)
Creatinine, Ser: 0.9 mg/dL (ref 0.44–1.00)
GFR, Estimated: >60 mL/min (ref >60)
Glucose, Bld: 89 mg/dL (ref 70–99)
Potassium: 4.1 mmol/L (ref 3.5–5.1)
Sodium: 138 mmol/L (ref 135–145)
Total Bilirubin: 3.3 mg/dL — ABNORMAL HIGH (ref 0.3–1.2)
Total Protein: 7.5 g/dL (ref 6.5–8.1)

## 2021-06-19 LAB — LIPASE, BLOOD: Lipase: 31 U/L (ref 11–51)

## 2021-06-19 LAB — I-STAT BETA HCG BLOOD, ED (MC, WL, AP ONLY): I-stat hCG, quantitative: <5 m[IU]/mL (ref <5)

## 2021-06-19 NOTE — ED Triage Notes (Signed)
Pt presents to the ED from home with complaints of abd pain, N/V, denies diarrhea. Denies fever. Painful with walking.

## 2021-06-19 NOTE — ED Provider Notes (Signed)
Emergency Medicine Provider Triage Evaluation Note  Isabella Pena , Pena 25 y.o. female  was evaluated in triage.  Pt complains of epigastric abdominal pain.  Goes into her back.  Started 1 hour PTA.  Pain is constant.  No prior history of pancreatitis.  No prior abdominal surgeries.  Does not radiate to right upper quadrant.  No nausea, vomiting.  Has not taken anything for symptoms.  No chronic NSAID use, EtOH use.  No melena, blood per rectum.  No chest pain, shortness of breath.  Review of Systems  Positive: Epigastric abdominal pain Negative: Nausea, vomiting, diarrhea, melena  Physical Exam  BP 133/82   Pulse (!) 107   Temp 99.3 F (37.4 C) (Oral)   Resp 16   Ht 5' (1.524 m)   Wt 122.5 kg   LMP 05/24/2021 (Exact Date)   SpO2 100%   BMI 52.73 kg/m  Gen:   Awake, no distress   Resp:  Normal effort  ABD:  Difficult exam due to body habitus however generalized tenderness epigastric region, negative Murphy sign MSK:   Moves extremities without difficulty  Other:    Medical Decision Making  Medically screening exam initiated at 10:29 PM.  Appropriate orders placed.  Isabella Pena was informed that the remainder of the evaluation will be completed by another provider, this initial triage assessment does not replace that evaluation, and the importance of remaining in the ED until their evaluation is complete.  Epigastric abd pain   Isabella Clauson A, PA-C 06/19/21 2230    Isabella Plan, MD 06/22/21 1536

## 2021-06-20 ENCOUNTER — Emergency Department (HOSPITAL_COMMUNITY): Payer: Medicaid Other

## 2021-06-20 NOTE — ED Notes (Signed)
Pt called multiple no answer  

## 2021-06-20 NOTE — ED Notes (Signed)
Pt called multiple times no answer 

## 2021-06-21 ENCOUNTER — Telehealth: Payer: Self-pay

## 2021-06-21 DIAGNOSIS — Z9189 Other specified personal risk factors, not elsewhere classified: Secondary | ICD-10-CM

## 2021-06-21 NOTE — Telephone Encounter (Signed)
Transition Care Management Follow-up Telephone Call Date of discharge and from where: 06/20/2021-[facility]  How have you been since you were released from the hospital? Patient stated she is starting to feel better. Any questions or concerns? No  Items Reviewed: Did the pt receive and understand the discharge instructions provided? Yes  Medications obtained and verified?  No new medications given at discharge.  Other? No  Any new allergies since your discharge? No  Dietary orders reviewed? No Do you have support at home? Yes   Home Care and Equipment/Supplies: Were home health services ordered? not applicable If so, what is the name of the agency? N/A  Has the agency set up a time to come to the patient's home? not applicable Were any new equipment or medical supplies ordered?  No What is the name of the medical supply agency? N/A Were you able to get the supplies/equipment? not applicable Do you have any questions related to the use of the equipment or supplies? No  Functional Questionnaire: (I = Independent and D = Dependent) ADLs: I  Bathing/Dressing- I  Meal Prep- I  Eating- I  Maintaining continence- I  Transferring/Ambulation- I  Managing Meds- I  Follow up appointments reviewed:  PCP Hospital f/u appt confirmed? No   Specialist Hospital f/u appt confirmed? No   Are transportation arrangements needed? No  If their condition worsens, is the pt aware to call PCP or go to the Emergency Dept.? Yes Was the patient provided with contact information for the PCP's office or ED? Yes Was to pt encouraged to call back with questions or concerns? Yes

## 2021-07-13 DIAGNOSIS — R0789 Other chest pain: Secondary | ICD-10-CM | POA: Diagnosis not present

## 2021-07-13 DIAGNOSIS — R059 Cough, unspecified: Secondary | ICD-10-CM | POA: Diagnosis not present

## 2021-07-13 DIAGNOSIS — Z20822 Contact with and (suspected) exposure to covid-19: Secondary | ICD-10-CM | POA: Diagnosis not present

## 2021-07-13 DIAGNOSIS — R0602 Shortness of breath: Secondary | ICD-10-CM | POA: Diagnosis not present

## 2021-07-13 DIAGNOSIS — J3489 Other specified disorders of nose and nasal sinuses: Secondary | ICD-10-CM | POA: Diagnosis not present

## 2021-07-13 DIAGNOSIS — J111 Influenza due to unidentified influenza virus with other respiratory manifestations: Secondary | ICD-10-CM | POA: Diagnosis not present

## 2021-07-20 ENCOUNTER — Telehealth: Payer: Self-pay

## 2021-07-20 NOTE — Telephone Encounter (Signed)
° °  Telephone encounter was:  Unsuccessful.  07/20/2021 Name: Isabella Pena MRN: [id number] DOB: Nov 26, 1995  Unsuccessful outbound call made today to assist with:   PCP Information  Outreach Attempt:  1st Attempt  A HIPAA compliant voice message was left requesting a return call.  Instructed patient to call back at Instructed patient to call back at [phone]  at their earliest convenience. [place], Care Management  [phone] [address], [place], [place] [address] Phone: [phone] Email: [email or web address]

## 2021-07-20 NOTE — Telephone Encounter (Signed)
° °  Telephone encounter was:  Successful.  07/20/2021 Name: Isabella Pena MRN: [id number] DOB: Dec 06, 1995  Isabella Pena is a 26 y.o. year old female who is a primary care patient of Patient, No Pcp Per (Inactive) . The community resource team was consulted for assistance with  PCP Information  Care guide performed the following interventions: Patient provided with information about care guide support team and interviewed to confirm resource needs Will email PCP Information to [email or web address] .  Follow Up Plan:  No further follow up planned at this time. The patient has been provided with needed resources.    Lenard Forth Care Guide, Embedded Care Coordination [facility], Care Management  [phone] [address] [place], [place], [place] [address] Phone: [phone] Email: [email or web address]

## 2021-11-09 DIAGNOSIS — M26601 Right temporomandibular joint disorder, unspecified: Secondary | ICD-10-CM | POA: Diagnosis not present

## 2021-11-11 ENCOUNTER — Ambulatory Visit (HOSPITAL_COMMUNITY)
Admission: RE | Admit: 2021-11-11 | Discharge: 2021-11-11 | Disposition: A | Discharge status: Home / Self Care | Payer: Medicaid Other | Source: Ambulatory Visit

## 2021-11-11 ENCOUNTER — Other Ambulatory Visit: Payer: Self-pay

## 2021-11-11 ENCOUNTER — Encounter (HOSPITAL_COMMUNITY): Payer: Self-pay

## 2021-11-11 VITALS — BP 145/93 | HR 104 | Temp 98.8°F | Resp 22

## 2021-11-11 DIAGNOSIS — K068 Other specified disorders of gingiva and edentulous alveolar ridge: Secondary | ICD-10-CM | POA: Diagnosis not present

## 2021-11-11 MED ORDER — KETOROLAC TROMETHAMINE 10 MG PO TABS: 10.0000 mg | ORAL_TABLET | Freq: Four times a day (QID) | ORAL | 0 refills | Status: DC | PRN

## 2021-11-11 MED ORDER — PENICILLIN V POTASSIUM 500 MG PO TABS: 500.0000 mg | ORAL_TABLET | Freq: Four times a day (QID) | ORAL | 0 refills | Status: AC

## 2021-11-11 NOTE — ED Provider Notes (Signed)
?[facility] ? ? ? ?CSN: [id number] ?Arrival date & time: 11/11/21  1422 ? ? ?  ? ?History   ?Chief Complaint ?Chief Complaint  ?Patient presents with  ? Facial Pain  ? Appointment  ?  2:30pm  ? ? ?HPI ?Isabella Pena is a 26 y.o. female who presents with L upper gum pain x 1 week. Was told she has an extra tooth which is across the area she hurts that needs to be extracted, but she cant get in to see the dentist for a while.  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  ? ? ? ?Past Medical History:  ?Diagnosis Date  ? Hypertension   ? Stomach inflammation 2022  ? Urinary tract infection   ? ? ?Patient Active Problem List  ? Diagnosis Date Noted  ? Acute myopericarditis 05/05/2016  ? Acute UTI   ? Chest pain   ? UTI (urinary tract infection) 05/03/2016  ? Hypokalemia 05/03/2016  ? Normocytic anemia 05/03/2016  ? ? ?Past Surgical History:  ?Procedure Laterality Date  ? BLADDER NECK RECONSTRUCTION    ? ? ?OB History   ? ? Gravida  ?1  ? Para  ?   ? Term  ?   ? Preterm  ?   ? AB  ?   ? Living  ?   ?  ? ? SAB  ?   ? IAB  ?   ? Ectopic  ?   ? Multiple  ?   ? Live Births  ?   ?   ?  ?  ? ? ? ?Home Medications   ? ?Prior to Admission medications   ?Medication Sig Start Date End Date Taking? Authorizing Provider  ?ketorolac (TORADOL) 10 MG tablet Take 1 tablet (10 mg total) by mouth every 6 (six) hours as needed. 11/11/21  Yes Rodriguez-Southworth, Sunday Spillers, PA-C  ?penicillin v potassium (VEETID) 500 MG tablet Take 1 tablet (500 mg total) by mouth 4 (four) times daily for 10 days. 11/11/21 11/21/21 Yes Rodriguez-Southworth, Sunday Spillers, PA-C  ?acetaminophen (TYLENOL) 500 MG tablet Take 1,000 mg by mouth every 6 (six) hours as needed for mild pain or headache.    [provider]  ?cyclobenzaprine (FLEXERIL) 10 MG tablet Take 10 mg by mouth at bedtime. 11/09/21   [provider]  ?Doxylamine-Pyridoxine 10-10 MG TBEC Take 2 tablets by  mouth at bedtime as needed. ?Patient not taking: Reported on 06/03/2019 05/19/19   Wieters, Madelynn Done C, PA-C  ?naproxen (NAPROSYN) 500 MG tablet Take 500 mg by mouth 2 (two) times daily. 11/09/21   [provider]  ?Prenatal Vit-Fe Fumarate-FA (MULTIVITAMIN-PRENATAL) 27-0.8 MG TABS tablet Take 1 tablet by mouth daily at 12 noon.    [provider]  ? ? ?Family History ?Family History  ?Problem Relation Age of Onset  ? Hyperlipidemia Father   ? Asthma Father   ? Pancreatic cancer Paternal Grandmother   ? Healthy Mother   ? ? ?Social History ?Social History  ? ?Tobacco Use  ? Smoking status: Every Day  ?  Types: Cigars  ? Smokeless tobacco: Never  ?Vaping Use  ? Vaping Use: Never used  ?Substance Use Topics  ? Alcohol use: Yes  ?  Comment: occasionally  ? Drug use: Yes  ?  Types: Marijuana  ? ? ? ?Allergies   ?Ciprofloxacin ? ? ?Review of Systems ?Review of Systems  ?Constitutional:  Negative  for diaphoresis and fever.  ?HENT:  Negative for dental problem, ear discharge, ear pain, facial swelling, postnasal drip, rhinorrhea and sore throat.   ?Hematological:  Negative for adenopathy.  ? ? ?Physical Exam ?Triage Vital Signs ?ED Triage Vitals  ?Enc Vitals Group  ?   BP 11/11/21 1445 (!) 145/93  ?   Pulse Rate 11/11/21 1445 (!) 104  ?   Resp 11/11/21 1445 (!) 22  ?   Temp 11/11/21 1445 98.8 ?F (37.1 ?C)  ?   Temp Source 11/11/21 1445 Oral  ?   SpO2 11/11/21 1445 96 %  ?   Weight --   ?   Height --   ?   Head Circumference --   ?   Peak Flow --   ?   Pain Score 11/11/21 1443 10  ?   Pain Loc --   ?   Pain Edu? --   ?   Excl. in [place]? --   ? ?No data found. ? ?Updated Vital Signs ?BP (!) 145/93 (BP Location: Left Arm) Comment (BP Location): regular cuff on forearm  Pulse (!) 104   Temp 98.8 ?F (37.1 ?C) (Oral)   Resp (!) 22   SpO2 96%  ? ?Visual Acuity ?Right Eye Distance:   ?Left Eye Distance:   ?Bilateral Distance:   ? ?Right Eye Near:   ?Left Eye Near:    ?Bilateral Near:    ? ?Physical Exam ?Vitals and  nursing note reviewed.  ?Constitutional:   ?   General: She is not in acute distress. ?   Appearance: She is obese. She is not toxic-appearing.  ?HENT:  ?   Right Ear: External ear normal.  ?   Left Ear: External ear normal.  ?   Nose:  ?   Comments: Her maxillary sinuses are not tender ?   Mouth/Throat:  ?   Mouth: Mucous membranes are moist. No oral lesions.  ?   Dentition: Dental tenderness present. No gingival swelling, dental caries, dental abscesses or gum lesions.  ?   Pharynx: Oropharynx is clear.  ?   Comments: Has tenderness on mid R upper lateral gum area with palpation.  ?Eyes:  ?   General: No scleral icterus. ?   Conjunctiva/sclera: Conjunctivae normal.  ?Pulmonary:  ?   Effort: Pulmonary effort is normal.  ?Musculoskeletal:  ?   Cervical back: Neck supple.  ?Lymphadenopathy:  ?   Cervical: No cervical adenopathy.  ?Neurological:  ?   General: No focal deficit present.  ?   Mental Status: She is alert and oriented to person, place, and time.  ?   Gait: Gait normal.  ?Psychiatric:     ?   Mood and Affect: Mood normal.     ?   Behavior: Behavior normal.     ?   Thought Content: Thought content normal.     ?   Judgment: Judgment normal.  ? ? ? ?UC Treatments / Results  ?Labs ?(all labs ordered are listed, but only abnormal results are displayed) ?Labs Reviewed - No data to display ? ?EKG ? ? ?Radiology ?No results found. ? ?Procedures ?Procedures (including critical care time) ? ?Medications Ordered in UC ?Medications - No data to display ? ?Initial Impression / Assessment and Plan / UC Course  ?I have reviewed the triage vital signs and the nursing notes. ? ?Gum pain, I am concerned she may have an infection deep where the root is. ?I placed her on Penicillin as noted and  will have her try Toradol  as noted instead of the Naproxen.  ?Advised to FU with her dentist ? ? ?Final Clinical Impressions(s) / UC Diagnoses  ? ?Final diagnoses:  ?Pain in gums  ? ? ? ?Discharge Instructions   ? ?  ?Make a follow up  with your dentist ?Do not take the Naproxen while taking the Toradol  ? ? ? ?ED Prescriptions   ? ? Medication Sig Dispense Auth. Provider  ? penicillin v potassium (VEETID) 500 MG tablet Take 1 tablet (500 mg total) by mouth 4 (four) times daily for 10 days. 40 tablet Rodriguez-Southworth, Sunday Spillers, PA-C  ? ketorolac (TORADOL) 10 MG tablet Take 1 tablet (10 mg total) by mouth every 6 (six) hours as needed. 15 tablet Rodriguez-Southworth, Sunday Spillers, PA-C  ? ?  ? ?I have reviewed the PDMP during this encounter. ?  ?Shelby Mattocks, PA-C ?11/11/21 1517 ? ?
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
## 2021-11-11 NOTE — Discharge Instructions (Signed)
Make a follow up with your dentist ?Do not take the Naproxen while taking the Toradol  ?

## 2022-02-27 ENCOUNTER — Other Ambulatory Visit: Payer: Self-pay

## 2022-02-27 ENCOUNTER — Ambulatory Visit (HOSPITAL_COMMUNITY)
Admission: EM | Admit: 2022-02-27 | Discharge: 2022-02-27 | Disposition: A | Discharge status: Home / Self Care | Payer: Medicaid Other | Attending: Emergency Medicine | Admitting: Emergency Medicine

## 2022-02-27 ENCOUNTER — Encounter (HOSPITAL_COMMUNITY): Payer: Self-pay | Admitting: Emergency Medicine

## 2022-02-27 DIAGNOSIS — R112 Nausea with vomiting, unspecified: Secondary | ICD-10-CM | POA: Diagnosis not present

## 2022-02-27 DIAGNOSIS — R1013 Epigastric pain: Secondary | ICD-10-CM | POA: Insufficient documentation

## 2022-02-27 LAB — POCT URINALYSIS DIPSTICK, ED / UC
Bilirubin Urine: NEGATIVE
Glucose, UA: NEGATIVE mg/dL
Ketones, ur: NEGATIVE mg/dL
Nitrite: POSITIVE — AB
Protein, ur: NEGATIVE mg/dL
Specific Gravity, Urine: 1.015 (ref 1.005–1.030)
Urobilinogen, UA: 2 mg/dL — ABNORMAL HIGH (ref 0.0–1.0)
pH: 7 (ref 5.0–8.0)

## 2022-02-27 LAB — CBC WITH DIFFERENTIAL/PLATELET
Abs Immature Granulocytes: 0.04 10*3/uL (ref 0.00–0.07)
Basophils Absolute: 0 10*3/uL (ref 0.0–0.1)
Basophils Relative: 0 %
Eosinophils Absolute: 0.1 10*3/uL (ref 0.0–0.5)
Eosinophils Relative: 1 %
HCT: 42.7 % (ref 36.0–46.0)
Hemoglobin: 14 g/dL (ref 12.0–15.0)
Immature Granulocytes: 0 %
Lymphocytes Relative: 36 %
Lymphs Abs: 3.3 10*3/uL (ref 0.7–4.0)
MCH: 28.7 pg (ref 26.0–34.0)
MCHC: 32.8 g/dL (ref 30.0–36.0)
MCV: 87.5 fL (ref 80.0–100.0)
Monocytes Absolute: 0.6 10*3/uL (ref 0.1–1.0)
Monocytes Relative: 6 %
Neutro Abs: 5.3 10*3/uL (ref 1.7–7.7)
Neutrophils Relative %: 57 %
Platelets: 258 10*3/uL (ref 150–400)
RBC: 4.88 MIL/uL (ref 3.87–5.11)
RDW: 12.3 % (ref 11.5–15.5)
WBC: 9.4 10*3/uL (ref 4.0–10.5)
nRBC: 0 % (ref 0.0–0.2)

## 2022-02-27 LAB — COMPREHENSIVE METABOLIC PANEL
ALT: 111 U/L — ABNORMAL HIGH (ref 0–44)
AST: 218 U/L — ABNORMAL HIGH (ref 15–41)
Albumin: 3.5 g/dL (ref 3.5–5.0)
Alkaline Phosphatase: 98 U/L (ref 38–126)
Anion gap: 7 (ref 5–15)
BUN: 12 mg/dL (ref 6–20)
CO2: 25 mmol/L (ref 22–32)
Calcium: 9.1 mg/dL (ref 8.9–10.3)
Chloride: 108 mmol/L (ref 98–111)
Creatinine, Ser: 0.96 mg/dL (ref 0.44–1.00)
GFR, Estimated: >60 mL/min (ref >60)
Glucose, Bld: 88 mg/dL (ref 70–99)
Potassium: 4.4 mmol/L (ref 3.5–5.1)
Sodium: 140 mmol/L (ref 135–145)
Total Bilirubin: 0.9 mg/dL (ref 0.3–1.2)
Total Protein: 7.8 g/dL (ref 6.5–8.1)

## 2022-02-27 LAB — POC URINE PREG, ED: Preg Test, Ur: NEGATIVE

## 2022-02-27 LAB — LIPASE, BLOOD: Lipase: 27 U/L (ref 11–51)

## 2022-02-27 MED ORDER — ONDANSETRON 4 MG PO TBDP
4.0000 mg | ORAL_TABLET | Freq: Once | ORAL | Status: AC
  Administered 2022-02-27: 4 mg via ORAL

## 2022-02-27 MED ORDER — ALUM & MAG HYDROXIDE-SIMETH 200-200-20 MG/5ML PO SUSP
30.0000 mL | Freq: Once | ORAL | Status: AC
  Administered 2022-02-27: 30 mL via ORAL

## 2022-02-27 MED ORDER — ONDANSETRON 4 MG PO TBDP
ORAL_TABLET | ORAL | Status: AC
  Filled 2022-02-27: qty 1

## 2022-02-27 MED ORDER — ONDANSETRON HCL 4 MG PO TABS: 4.0000 mg | ORAL_TABLET | Freq: Four times a day (QID) | ORAL | 0 refills | Status: DC

## 2022-02-27 MED ORDER — LIDOCAINE VISCOUS HCL 2 % MT SOLN
OROMUCOSAL | Status: AC
  Filled 2022-02-27: qty 15

## 2022-02-27 MED ORDER — ALUM & MAG HYDROXIDE-SIMETH 200-200-20 MG/5ML PO SUSP
ORAL | Status: AC
  Filled 2022-02-27: qty 30

## 2022-02-27 MED ORDER — LIDOCAINE VISCOUS HCL 2 % MT SOLN
15.0000 mL | Freq: Once | OROMUCOSAL | Status: AC
  Administered 2022-02-27: 15 mL via ORAL

## 2022-02-27 NOTE — ED Provider Notes (Signed)
[facility]    CSN: [id number] Arrival date & time: 02/27/22  2993     History   Chief Complaint Chief Complaint  Patient presents with   Abdominal Pain    HPI Isabella Pena is a 26 y.o. female.  Presents with a 1 day history of epigastric pain. Reports at 5 AM she woke up with pain and felt like it was radiating to her back. Sensation has somewhat resolved but she has some residual pain in the mid back.   Reports 1 episode of vomiting today, some loose stool. Did not try any medicine for symptoms No appetite today, does not know if pain worsens with food Denies fever, chills, lower abd cramping, urinary symptoms.  She is due for her period and believes it's starting today. Noticed some spotting yesterday.  Hx of UTI and bladder neck reconstruction. Denies urinary symptoms   Past Medical History:  Diagnosis Date   Hypertension    Stomach inflammation 2022   Urinary tract infection     Patient Active Problem List   Diagnosis Date Noted   Acute myopericarditis 05/05/2016   Acute UTI    Chest pain    UTI (urinary tract infection) 05/03/2016   Hypokalemia 05/03/2016   Normocytic anemia 05/03/2016    Past Surgical History:  Procedure Laterality Date   BLADDER NECK RECONSTRUCTION      OB History     Gravida  1   Para      Term      Preterm      AB      Living         SAB      IAB      Ectopic      Multiple      Live Births               Home Medications    Prior to Admission medications   Medication Sig Start Date End Date Taking? Authorizing Provider  ondansetron (ZOFRAN) 4 MG tablet Take 1 tablet (4 mg total) by mouth every 6 (six) hours. 02/27/22  Yes Zela Sobieski, Lurena Joiner, PA-C  acetaminophen (TYLENOL) 500 MG tablet Take 1,000 mg by mouth every 6 (six) hours as needed for mild pain or headache.    [provider]  cyclobenzaprine (FLEXERIL) 10 MG tablet Take 10 mg by mouth at bedtime. Patient not taking:  Reported on 02/27/2022 11/09/21   [provider]  Doxylamine-Pyridoxine 10-10 MG TBEC Take 2 tablets by mouth at bedtime as needed. Patient not taking: Reported on 06/03/2019 05/19/19   Wieters, Hallie C, PA-C  ketorolac (TORADOL) 10 MG tablet Take 1 tablet (10 mg total) by mouth every 6 (six) hours as needed. Patient not taking: Reported on 02/27/2022 11/11/21   Rodriguez-Southworth, Nettie Elm, PA-C  naproxen (NAPROSYN) 500 MG tablet Take 500 mg by mouth 2 (two) times daily. Patient not taking: Reported on 02/27/2022 11/09/21   [provider]  Prenatal Vit-Fe Fumarate-FA (MULTIVITAMIN-PRENATAL) 27-0.8 MG TABS tablet Take 1 tablet by mouth daily at 12 noon. Patient not taking: Reported on 02/27/2022    [provider]    Family History Family History  Problem Relation Age of Onset   Hyperlipidemia Father    Asthma Father    Pancreatic cancer Paternal Grandmother    Healthy Mother     Social History Social History   Tobacco Use   Smoking status: Every Day    Types: Cigars   Smokeless tobacco: Never  Vaping  Use   Vaping Use: Never used  Substance Use Topics   Alcohol use: Yes    Comment: occasionally   Drug use: Not Currently    Types: Marijuana     Allergies   Ciprofloxacin   Review of Systems Review of Systems  Gastrointestinal:  Positive for abdominal pain.   Per HPI  Physical Exam Triage Vital Signs ED Triage Vitals  Enc Vitals Group     BP 02/27/22 1049 (!) 151/71     Pulse Rate 02/27/22 1049 98     Resp 02/27/22 1049 (!) 22     Temp 02/27/22 1049 98.9 F (37.2 C)     Temp Source 02/27/22 1049 Oral     SpO2 02/27/22 1049 97 %     Weight --      Height --      Head Circumference --      Peak Flow --      Pain Score 02/27/22 1046 6     Pain Loc --      Pain Edu? --      Excl. in GC? --    No data found.  Updated Vital Signs BP (!) 151/71 (BP Location: Left Arm) Comment (BP Location): regular , forearm  Pulse 98   Temp 98.9 F  (37.2 C) (Oral)   Resp (!) 22   SpO2 97%    Physical Exam Vitals and nursing note reviewed.  Constitutional:      Appearance: Normal appearance.  HENT:     Mouth/Throat:     Mouth: Mucous membranes are moist.     Pharynx: Oropharynx is clear.  Eyes:     Conjunctiva/sclera: Conjunctivae normal.  Cardiovascular:     Rate and Rhythm: Normal rate and regular rhythm.     Heart sounds: Normal heart sounds.  Pulmonary:     Effort: Pulmonary effort is normal. No respiratory distress.     Breath sounds: Normal breath sounds.  Abdominal:     General: Bowel sounds are normal.     Palpations: Abdomen is soft.     Tenderness: There is abdominal tenderness in the epigastric area. There is no right CVA tenderness, left CVA tenderness, guarding or rebound.  Musculoskeletal:        General: Normal range of motion.  Skin:    General: Skin is warm and dry.  Neurological:     Mental Status: She is alert and oriented to person, place, and time.     UC Treatments / Results  Labs (all labs ordered are listed, but only abnormal results are displayed) Labs Reviewed  COMPREHENSIVE METABOLIC PANEL - Abnormal; Notable for the following components:      Result Value   AST 218 (*)    ALT 111 (*)    All other components within normal limits  POCT URINALYSIS DIPSTICK, ED / UC - Abnormal; Notable for the following components:   Hgb urine dipstick LARGE (*)    Urobilinogen, UA 2.0 (*)    Nitrite POSITIVE (*)    Leukocytes,Ua MODERATE (*)    All other components within normal limits  URINE CULTURE  CBC WITH DIFFERENTIAL/PLATELET  LIPASE, BLOOD  POC URINE PREG, ED    EKG   Radiology No results found.  Procedures Procedures   Medications Ordered in UC Medications  alum & mag hydroxide-simeth (MAALOX/MYLANTA) 200-200-20 MG/5ML suspension 30 mL (30 mLs Oral Given 02/27/22 1204)    And  lidocaine (XYLOCAINE) 2 % viscous mouth solution 15 mL (15 mLs Oral  Given 02/27/22 1204)  ondansetron  (ZOFRAN-ODT) disintegrating tablet 4 mg (4 mg Oral Given 02/27/22 1203)    Initial Impression / Assessment and Plan / UC Course  I have reviewed the triage vital signs and the nursing notes.  Pertinent labs & imaging results that were available during my care of the patient were reviewed by me and considered in my medical decision making (see chart for details).  Moderately tender in the epigastric area but no guarding or rebound. Zofran given with GI cocktail. Improvement in nausea and pain.  Urine pregnancy negative Urinalysis does have large hemoglobin, positive nitrates, moderate leuks.  Culture pending. Patient denies any urinary symptoms. States she usually knows when she has a UTI. Will wait for culture and treat if patient develops symptoms. Not likely cause of the epigastric pain and nausea. No CVA tenderness.  12:20 PM: at this time STAT labs pending - CBC, CMP, lipase. Will notify patient with any concerning results.  Strict ED precautions. She will increase fluids as tolerated, sent zofran to use every 6 hours as needed. Patient verbalizes understanding that she needs to be seen in the ED with anything worsening.  1:16 PM: CBC normal. Lipase normal. CMP with elevated livre enzymes but this was noted 8 months ago as well. Overall unremarkable labs  Final Clinical Impressions(s) / UC Diagnoses   Final diagnoses:  Epigastric abdominal pain  Nausea and vomiting, unspecified vomiting type     Discharge Instructions      We will call you in a few hours if anything in your bloodwork is abnormal.  Please go to the emergency department immediately if any of your symptoms worsen.  Try to stay hydrated! Take the zofran every 6 hours as needed for nausea.  If you have appetite, try bland foods like crackers or toast in small bites.     ED Prescriptions     Medication Sig Dispense Auth. Provider   ondansetron (ZOFRAN) 4 MG tablet Take 1 tablet (4 mg total) by mouth every 6  (six) hours. 12 tablet Damarys Speir, Lurena Joiner, PA-C      PDMP not reviewed this encounter.   Kathrine Haddock 02/27/22 1738

## 2022-02-27 NOTE — ED Triage Notes (Signed)
Complains of upper abdominal pain that started around 5 am.  Resolved, then came back.  Patient has had 2 episodes of diarrhea.  One episode of vomiting this morning.  Pain is center epigastric pain through to back.  Denies any sick contacts.

## 2022-02-27 NOTE — Discharge Instructions (Addendum)
We will call you in a few hours if anything in your bloodwork is abnormal.  Please go to the emergency department immediately if any of your symptoms worsen.  Try to stay hydrated! Take the zofran every 6 hours as needed for nausea.  If you have appetite, try bland foods like crackers or toast in small bites.

## 2022-02-28 LAB — URINE CULTURE

## 2022-03-05 ENCOUNTER — Ambulatory Visit (HOSPITAL_COMMUNITY)
Admission: EM | Admit: 2022-03-05 | Discharge: 2022-03-05 | Disposition: A | Discharge status: Home / Self Care | Payer: Medicaid Other | Attending: Family Medicine | Admitting: Family Medicine

## 2022-03-05 ENCOUNTER — Encounter (HOSPITAL_COMMUNITY): Payer: Self-pay | Admitting: *Deleted

## 2022-03-05 DIAGNOSIS — U071 COVID-19: Secondary | ICD-10-CM | POA: Diagnosis not present

## 2022-03-05 DIAGNOSIS — J069 Acute upper respiratory infection, unspecified: Secondary | ICD-10-CM | POA: Diagnosis not present

## 2022-03-05 LAB — POC INFLUENZA A AND B ANTIGEN (URGENT CARE ONLY)
INFLUENZA A ANTIGEN, POC: NEGATIVE
INFLUENZA B ANTIGEN, POC: NEGATIVE

## 2022-03-05 LAB — SARS CORONAVIRUS 2 (TAT 6-24 HRS): SARS Coronavirus 2: POSITIVE — AB

## 2022-03-05 MED ORDER — PROMETHAZINE-DM 6.25-15 MG/5ML PO SYRP: 5.0000 mL | ORAL_SOLUTION | Freq: Four times a day (QID) | ORAL | 0 refills | Status: AC | PRN

## 2022-03-05 NOTE — ED Provider Notes (Signed)
  [facility]   [id number] 03/05/22 Arrival Time: 1009  ASSESSMENT & PLAN:  1. Viral URI with cough     Discussed typical duration of viral illnesses. Rapid influenza: negative. COVID POSITIVE. No indication for antiviral treatment even if postiive. OTC symptom care as needed. Work note provided.  New Prescriptions   PROMETHAZINE-DEXTROMETHORPHAN (PROMETHAZINE-DM) 6.25-15 MG/5ML SYRUP    Take 5 mLs by mouth 4 (four) times daily as needed for cough.     Follow-up Information     [facility].   Specialty: Urgent Care Why: As needed. Contact information: [address] [place] [address] [phone]               Reviewed expectations re: course of current medical issues. Questions answered. Outlined signs and symptoms indicating need for more acute intervention. Understanding verbalized. After Visit Summary given.  SUBJECTIVE: History from: Patient. Isabella Pena is a 26 y.o. female. Reports: decreased smell; cough; HA; runny nose; x few days. Denies: fever. Normal PO intake without n/v/d.  OBJECTIVE:  Vitals:   03/05/22 1111  BP: (!) 106/54  Pulse: (!) 101  Resp: 18  Temp: 99.6 F (37.6 C)  TempSrc: Oral  SpO2: 98%    Tachycardia noted. General appearance: alert; no distress Eyes: PERRLA; EOMI; conjunctiva normal HENT: [place]; AT; with nasal congestion Neck: supple  Lungs: speaks full sentences without difficulty; unlabored; active cough; no wheezing Extremities: no edema Skin: warm and dry Neurologic: normal gait Psychological: alert and cooperative; normal mood and affect  Labs:  Labs Reviewed  SARS CORONAVIRUS 2 (TAT 6-24 HRS)  POC INFLUENZA A AND B ANTIGEN (URGENT CARE ONLY)     Allergies  Allergen Reactions   Ciprofloxacin Other (See Comments)    UTI and doesn't work for patient    Past Medical History:  Diagnosis Date   Hypertension    Stomach inflammation 2022    Urinary tract infection    Social History   Socioeconomic History   Marital status: Significant Other    Spouse name: Not on file   Number of children: Not on file   Years of education: Not on file   Highest education level: Not on file  Occupational History   Not on file  Tobacco Use   Smoking status: Every Day    Types: Cigars   Smokeless tobacco: Never  Vaping Use   Vaping Use: Never used  Substance and Sexual Activity   Alcohol use: Yes    Comment: occasionally   Drug use: Not Currently    Types: Marijuana   Sexual activity: Yes    Birth control/protection: Implant  Other Topics Concern   Not on file  Social History Narrative   Not on file   Social Determinants of Health   Financial Resource Strain: Not on file  Food Insecurity: Not on file  Transportation Needs: Not on file  Physical Activity: Not on file  Stress: Not on file  Social Connections: Not on file  Intimate Partner Violence: Not on file   Family History  Problem Relation Age of Onset   Hyperlipidemia Father    Asthma Father    Pancreatic cancer Paternal Grandmother    Healthy Mother    Past Surgical History:  Procedure Laterality Date   BLADDER NECK RECONSTRUCTION       Mardella Layman, MD 03/07/22 [phone]

## 2022-03-05 NOTE — ED Triage Notes (Signed)
Pt states that she woke up Saturday and couldn't smell. She is also having cough, headache, runny nose, congestion. She hasn't taken any OTC meds except IBU last night.

## 2022-03-05 NOTE — Discharge Instructions (Addendum)
You have been tested for COVID-19 today. If your test returns positive, you will receive a phone call from [facility] regarding your results. Negative test results are not called. Both positive and negative results area always visible on MyChart. If you do not have a MyChart account, sign up instructions are provided in your discharge papers. Please do not hesitate to contact us should you have questions or concerns.  Your flu test was negative. 

## 2022-03-15 ENCOUNTER — Encounter (HOSPITAL_COMMUNITY): Payer: Self-pay | Admitting: Emergency Medicine

## 2022-03-15 ENCOUNTER — Other Ambulatory Visit: Payer: Self-pay

## 2022-03-15 ENCOUNTER — Ambulatory Visit (HOSPITAL_COMMUNITY)
Admission: EM | Admit: 2022-03-15 | Discharge: 2022-03-15 | Disposition: A | Discharge status: Home / Self Care | Payer: Medicaid Other | Attending: Physician Assistant | Admitting: Physician Assistant

## 2022-03-15 DIAGNOSIS — R1013 Epigastric pain: Secondary | ICD-10-CM

## 2022-03-15 DIAGNOSIS — K2970 Gastritis, unspecified, without bleeding: Secondary | ICD-10-CM | POA: Diagnosis not present

## 2022-03-15 DIAGNOSIS — K2990 Gastroduodenitis, unspecified, without bleeding: Secondary | ICD-10-CM | POA: Diagnosis not present

## 2022-03-15 DIAGNOSIS — R112 Nausea with vomiting, unspecified: Secondary | ICD-10-CM

## 2022-03-15 MED ORDER — ONDANSETRON 4 MG PO TBDP
4.0000 mg | ORAL_TABLET | Freq: Once | ORAL | Status: AC
  Administered 2022-03-15: 4 mg via ORAL

## 2022-03-15 MED ORDER — ONDANSETRON HCL 4 MG PO TABS: 4.0000 mg | ORAL_TABLET | Freq: Four times a day (QID) | ORAL | 0 refills | Status: AC

## 2022-03-15 MED ORDER — ONDANSETRON 4 MG PO TBDP
ORAL_TABLET | ORAL | Status: AC
  Filled 2022-03-15: qty 1

## 2022-03-15 NOTE — Discharge Instructions (Addendum)
Advised to take the Zofran every 6 hours on a regular basis for the next 48 hours to help decrease abdominal pain and cramping. Advised to follow-up bland diet but start first with clear liquids Sprite-ginger ale-7-Up-10K-Gatorade.  And after next 48 hours go to a bland diet: Toast-crackers-chicken broth-chicken noodle soup. Advised to follow-up with PCP or return to urgent care if symptoms fail to improve.

## 2022-03-15 NOTE — ED Provider Notes (Signed)
[facility]    CSN: [id number] Arrival date & time: 03/15/22  1146      History   Chief Complaint Chief Complaint  Patient presents with   Abdominal Pain    HPI Isabella Pena is a 26 y.o. female.   26 year old female presents with nausea and vomiting.  Patient indicates that around 5:00 this morning she ate some leftover sauted shrimp, and about 20 minutes later started having abdominal pain, intense cramping, nausea with repeated vomiting.  She indicates she has continued with the cramping and abdominal pain which is mainly in the epigastric area and left upper quadrant continuing with intermittent vomiting.  She relates she has not had any fever or chills, no diarrhea, and no indigestion or reflux.  Patient indicates that the epigastric pain also goes to her back causing pain and discomfort.  Patient relates she did eat some of the shrimp when it was first cooked and started having some mild back pain about 20 minutes afterwards.  She relates she did not have any nausea or vomiting at that time.  She also relates her sister ate some of the shrimp but she does not know if she is sick or not.   Abdominal Pain   Past Medical History:  Diagnosis Date   Hypertension    Stomach inflammation 2022   Urinary tract infection     Patient Active Problem List   Diagnosis Date Noted   Acute myopericarditis 05/05/2016   Acute UTI    Chest pain    UTI (urinary tract infection) 05/03/2016   Hypokalemia 05/03/2016   Normocytic anemia 05/03/2016    Past Surgical History:  Procedure Laterality Date   BLADDER NECK RECONSTRUCTION      OB History     Gravida  1   Para      Term      Preterm      AB      Living         SAB      IAB      Ectopic      Multiple      Live Births               Home Medications    Prior to Admission medications   Medication Sig Start Date End Date Taking? Authorizing Provider  ondansetron (ZOFRAN) 4 MG tablet  Take 1 tablet (4 mg total) by mouth every 6 (six) hours. 03/15/22  Yes Ellsworth Lennox, PA-C  etonogestrel (NEXPLANON) 68 MG IMPL implant 1 each by Subdermal route once.    [provider]  promethazine-dextromethorphan (PROMETHAZINE-DM) 6.25-15 MG/5ML syrup Take 5 mLs by mouth 4 (four) times daily as needed for cough. Patient not taking: Reported on 03/15/2022 03/05/22   Mardella Layman, MD    Family History Family History  Problem Relation Age of Onset   Hyperlipidemia Father    Asthma Father    Pancreatic cancer Paternal Grandmother    Healthy Mother     Social History Social History   Tobacco Use   Smoking status: Every Day    Types: Cigars   Smokeless tobacco: Never  Vaping Use   Vaping Use: Never used  Substance Use Topics   Alcohol use: Yes    Comment: occasionally   Drug use: Not Currently    Types: Marijuana     Allergies   Ciprofloxacin   Review of Systems Review of Systems  Gastrointestinal:  Positive for abdominal pain (epigastric and back).  Physical Exam Triage Vital Signs ED Triage Vitals  Enc Vitals Group     BP 03/15/22 1221 115/79     Pulse Rate 03/15/22 1221 85     Resp 03/15/22 1221 20     Temp 03/15/22 1221 98.1 F (36.7 C)     Temp Source 03/15/22 1221 Oral     SpO2 03/15/22 1221 94 %     Weight --      Height --      Head Circumference --      Peak Flow --      Pain Score 03/15/22 1218 8     Pain Loc --      Pain Edu? --      Excl. in GC? --    No data found.  Updated Vital Signs BP 115/79 (BP Location: Left Arm) Comment (BP Location): regular cuff, forearm  Pulse 85   Temp 98.1 F (36.7 C) (Oral)   Resp 20   LMP  (LMP Unknown)   SpO2 94%   Visual Acuity Right Eye Distance:   Left Eye Distance:   Bilateral Distance:    Right Eye Near:   Left Eye Near:    Bilateral Near:     Physical Exam Constitutional:      Appearance: She is well-developed.  Abdominal:     General: Abdomen is flat.     Palpations:  Abdomen is soft.     Tenderness: There is abdominal tenderness in the epigastric area and left upper quadrant. There is guarding. There is no rebound.    Neurological:     Mental Status: She is alert.      UC Treatments / Results  Labs (all labs ordered are listed, but only abnormal results are displayed) Labs Reviewed - No data to display  EKG   Radiology No results found.  Procedures Procedures (including critical care time)  Medications Ordered in UC Medications  ondansetron (ZOFRAN-ODT) disintegrating tablet 4 mg (4 mg Oral Given 03/15/22 1305)    Initial Impression / Assessment and Plan / UC Course  I have reviewed the triage vital signs and the nursing notes.  Pertinent labs & imaging results that were available during my care of the patient were reviewed by me and considered in my medical decision making (see chart for details).    Plan: 1.  Advised take the Zofran every 6 hours on a regular basis for the next 48 to 72 hours to control the abdominal cramping and nausea. 2.  Advised to remain on clear liquid diet: Sprite, ginger ale, 7-Up, 10K Gatorade.  And move into a bland diet after 24 hours: Toast, crackers, grits, chicken broth, chicken no soup. 3.  Advised to follow-up PCP or return to urgent care if symptoms fail to improve. Final Clinical Impressions(s) / UC Diagnoses   Final diagnoses:  Epigastric pain  Nausea and vomiting, unspecified vomiting type  Gastritis and gastroduodenitis     Discharge Instructions      Advised to take the Zofran every 6 hours on a regular basis for the next 48 hours to help decrease abdominal pain and cramping. Advised to follow-up bland diet but start first with clear liquids Sprite-ginger ale-7-Up-10K-Gatorade.  And after next 48 hours go to a bland diet: Toast-crackers-chicken broth-chicken noodle soup. Advised to follow-up with PCP or return to urgent care if symptoms fail to improve.    ED Prescriptions      Medication Sig Dispense Auth. Provider   ondansetron (ZOFRAN) 4 MG  tablet Take 1 tablet (4 mg total) by mouth every 6 (six) hours. 12 tablet Ellsworth Lennox, PA-C      PDMP not reviewed this encounter.   Ellsworth Lennox, PA-C 03/15/22 1307

## 2022-03-15 NOTE — ED Triage Notes (Signed)
Epigastric pain started this morning.  Center epigastric pain radiating through to back.  Patient has had nausea and vomiting.  Last BM was yesterday, normal per patient.    Last meal was seafood-shrimp-eaten around midnight -1 am.

## 2023-09-12 ENCOUNTER — Encounter (HOSPITAL_COMMUNITY): Payer: Self-pay | Admitting: Emergency Medicine

## 2023-09-12 ENCOUNTER — Emergency Department (HOSPITAL_COMMUNITY)
Admission: EM | Admit: 2023-09-12 | Discharge: 2023-09-13 | Disposition: A | Discharge status: Home / Self Care | Attending: Emergency Medicine | Admitting: Emergency Medicine

## 2023-09-12 ENCOUNTER — Other Ambulatory Visit: Payer: Self-pay

## 2023-09-12 DIAGNOSIS — R1013 Epigastric pain: Secondary | ICD-10-CM | POA: Diagnosis not present

## 2023-09-12 DIAGNOSIS — R112 Nausea with vomiting, unspecified: Secondary | ICD-10-CM | POA: Insufficient documentation

## 2023-09-12 NOTE — ED Triage Notes (Addendum)
 Patient complaining of epigastric pain for a few days. Reports nausea and vomiting. Denies any agrevating or alleviating factors. Unable to keep food down.

## 2023-09-13 LAB — CBC
HCT: 44 % (ref 36.0–46.0)
Hemoglobin: 14.5 g/dL (ref 12.0–15.0)
MCH: 29.2 pg (ref 26.0–34.0)
MCHC: 33 g/dL (ref 30.0–36.0)
MCV: 88.7 fL (ref 80.0–100.0)
Platelets: 224 10*3/uL (ref 150–400)
RBC: 4.96 MIL/uL (ref 3.87–5.11)
RDW: 12.6 % (ref 11.5–15.5)
WBC: 8.8 10*3/uL (ref 4.0–10.5)
nRBC: 0 % (ref 0.0–0.2)

## 2023-09-13 LAB — COMPREHENSIVE METABOLIC PANEL
ALT: 18 U/L (ref 0–44)
AST: 23 U/L (ref 15–41)
Albumin: 3.6 g/dL (ref 3.5–5.0)
Alkaline Phosphatase: 59 U/L (ref 38–126)
Anion gap: 15 (ref 5–15)
BUN: 18 mg/dL (ref 6–20)
CO2: 22 mmol/L (ref 22–32)
Calcium: 9.5 mg/dL (ref 8.9–10.3)
Chloride: 106 mmol/L (ref 98–111)
Creatinine, Ser: 1 mg/dL (ref 0.44–1.00)
GFR, Estimated: >60 mL/min (ref >60)
Glucose, Bld: 99 mg/dL (ref 70–99)
Potassium: 4.2 mmol/L (ref 3.5–5.1)
Sodium: 143 mmol/L (ref 135–145)
Total Bilirubin: 0.9 mg/dL (ref 0.0–1.2)
Total Protein: 7.8 g/dL (ref 6.5–8.1)

## 2023-09-13 LAB — HCG, SERUM, QUALITATIVE: Preg, Serum: NEGATIVE

## 2023-09-13 LAB — LIPASE, BLOOD: Lipase: 32 U/L (ref 11–51)

## 2023-09-13 MED ORDER — FAMOTIDINE 20 MG PO TABS: 20.0000 mg | ORAL_TABLET | Freq: Two times a day (BID) | ORAL | 0 refills | Status: AC

## 2023-09-13 MED ORDER — ONDANSETRON 4 MG PO TBDP: 4.0000 mg | ORAL_TABLET | Freq: Three times a day (TID) | ORAL | 0 refills | Status: AC | PRN

## 2023-09-13 NOTE — ED Notes (Signed)
 ..  The patient is A&OX4, ambulatory at d/c with independent steady gait, NAD. Pt verbalized understanding of d/c instructions, prescription and follow up care.

## 2023-09-13 NOTE — ED Provider Notes (Signed)
 [place] EMERGENCY DEPARTMENT AT [facility] Provider Note   CSN: [id number] Arrival date & time: 09/12/23  2340     History  Chief Complaint  Patient presents with   Abdominal Pain    Isabella Pena is a 28 y.o. female.  The history is provided by the patient.  Abdominal Pain Isabella Pena is a 28 y.o. female who presents to the Emergency Department complaining of abdominal pain.  She presents to the emergency department for abdominal pain that has been waxing waning for the last 2 days.  She has been fasting for Ramadan.  Last night she had some mild epigastric pain.  Tonight she developed severe epigastric pain that was described as sharp and stabbing that radiated to her chest.  She had vomiting after the episode and her pain resolved.  She did have a bowel movement and urinated.  No diarrhea, fever, dysuria.  She has a former history of UTIs. She had chicken for dinner.     Home Medications Prior to Admission medications   Medication Sig Start Date End Date Taking? Authorizing Provider  famotidine (PEPCID) 20 MG tablet Take 1 tablet (20 mg total) by mouth 2 (two) times daily. 09/13/23  Yes Tilden Fossa, MD  ondansetron (ZOFRAN-ODT) 4 MG disintegrating tablet Take 1 tablet (4 mg total) by mouth every 8 (eight) hours as needed. 09/13/23  Yes Tilden Fossa, MD  etonogestrel (NEXPLANON) 68 MG IMPL implant 1 each by Subdermal route once.    [provider]  ondansetron (ZOFRAN) 4 MG tablet Take 1 tablet (4 mg total) by mouth every 6 (six) hours. 03/15/22   Ellsworth Lennox, PA-C  promethazine-dextromethorphan (PROMETHAZINE-DM) 6.25-15 MG/5ML syrup Take 5 mLs by mouth 4 (four) times daily as needed for cough. Patient not taking: Reported on 03/15/2022 03/05/22   Mardella Layman, MD      Allergies    Ciprofloxacin    Review of Systems   Review of Systems  Gastrointestinal:  Positive for abdominal pain.  All other systems reviewed and are  negative.   Physical Exam Updated Vital Signs BP 135/79   Pulse 74   Temp 98.2 F (36.8 C) (Oral)   Resp 18   Ht 5\' 1"  (1.549 m)   Wt 122.5 kg   SpO2 100%   BMI 51.03 kg/m  Physical Exam Vitals and nursing note reviewed.  Constitutional:      Appearance: She is well-developed.  HENT:     Head: Normocephalic and atraumatic.  Cardiovascular:     Rate and Rhythm: Normal rate and regular rhythm.  Pulmonary:     Effort: Pulmonary effort is normal. No respiratory distress.  Abdominal:     Palpations: Abdomen is soft.     Tenderness: There is no abdominal tenderness. There is no guarding or rebound.  Musculoskeletal:        General: No tenderness.  Skin:    General: Skin is warm and dry.  Neurological:     Mental Status: She is alert and oriented to person, place, and time.  Psychiatric:        Behavior: Behavior normal.     ED Results / Procedures / Treatments   Labs (all labs ordered are listed, but only abnormal results are displayed) Labs Reviewed  LIPASE, BLOOD  COMPREHENSIVE METABOLIC PANEL  CBC  HCG, SERUM, QUALITATIVE  URINALYSIS, ROUTINE W REFLEX MICROSCOPIC    EKG EKG Interpretation Date/Time:  Thursday September 12 2023 23:52:19 EST Ventricular Rate:  98 PR Interval:  130 QRS Duration:  86 QT Interval:  324 QTC Calculation: 413 R Axis:   67  Text Interpretation: Normal sinus rhythm with sinus arrhythmia Normal ECG Confirmed by Tilden Fossa [phone]) on 09/13/2023 1:28:57 AM  Radiology No results found.  Procedures Procedures    Medications Ordered in ED Medications - No data to display  ED Course/ Medical Decision Making/ A&P                                 Medical Decision Making Amount and/or Complexity of Data Reviewed Labs: ordered.  Risk Prescription drug management.   Patient here for evaluation of upper abdominal pain, vomiting that started after she broke her fast.  Pain has resolved at time of ED evaluation.  Abdomen is soft  and nontender.  Labs are reassuring.  She is able to eat and drink without recurrent pain.  Current clinical picture is not consistent with pancreatitis, perforated viscus, cholecystitis, appendicitis.  Discussed with patient home care for epigastric pain.  Will prescribe as needed ondansetron as well as Pepcid for possible reflux component.  Plan to discharge with outpatient follow-up and return precautions.        Final Clinical Impression(s) / ED Diagnoses Final diagnoses:  Epigastric pain  Nausea and vomiting, unspecified vomiting type    Rx / DC Orders ED Discharge Orders          Ordered    ondansetron (ZOFRAN-ODT) 4 MG disintegrating tablet  Every 8 hours PRN        09/13/23 0131    famotidine (PEPCID) 20 MG tablet  2 times daily        09/13/23 0131              Tilden Fossa, MD 09/13/23 [phone]
